# Patient Record
Sex: Female | Born: 1955 | Race: White | Hispanic: No | Smoking: Never smoker
Health system: Southern US, Community
[De-identification: ages and names within clinical notes are randomized; demographics above are authoritative.]

## PROBLEM LIST (undated history)

## (undated) DIAGNOSIS — M549 Dorsalgia, unspecified: Secondary | ICD-10-CM

## (undated) DIAGNOSIS — E119 Type 2 diabetes mellitus without complications: Secondary | ICD-10-CM

## (undated) DIAGNOSIS — N200 Calculus of kidney: Secondary | ICD-10-CM

## (undated) DIAGNOSIS — M51369 Other intervertebral disc degeneration, lumbar region without mention of lumbar back pain or lower extremity pain: Secondary | ICD-10-CM

## (undated) DIAGNOSIS — M5136 Other intervertebral disc degeneration, lumbar region: Secondary | ICD-10-CM

## (undated) DIAGNOSIS — K449 Diaphragmatic hernia without obstruction or gangrene: Secondary | ICD-10-CM

## (undated) DIAGNOSIS — G8929 Other chronic pain: Secondary | ICD-10-CM

## (undated) DIAGNOSIS — R109 Unspecified abdominal pain: Secondary | ICD-10-CM

## (undated) DIAGNOSIS — M75100 Unspecified rotator cuff tear or rupture of unspecified shoulder, not specified as traumatic: Secondary | ICD-10-CM

## (undated) HISTORY — PX: HERNIA REPAIR: SHX51

## (undated) HISTORY — PX: CHOLECYSTECTOMY: SHX55

## (undated) HISTORY — PX: BACK SURGERY: SHX140

## (undated) HISTORY — PX: HIATAL HERNIA REPAIR: SHX195

## (undated) HISTORY — PX: OTHER SURGICAL HISTORY: SHX169

## (undated) HISTORY — PX: ABDOMINAL HYSTERECTOMY: SHX81

---

## 2013-06-08 ENCOUNTER — Encounter (HOSPITAL_COMMUNITY): Payer: Self-pay | Admitting: Emergency Medicine

## 2013-06-08 ENCOUNTER — Emergency Department (HOSPITAL_COMMUNITY): Payer: Self-pay

## 2013-06-08 ENCOUNTER — Emergency Department (HOSPITAL_COMMUNITY)
Admission: EM | Admit: 2013-06-08 | Discharge: 2013-06-08 | Disposition: A | Discharge status: Home / Self Care | Payer: Self-pay | Attending: Emergency Medicine | Admitting: Emergency Medicine

## 2013-06-08 DIAGNOSIS — Z87442 Personal history of urinary calculi: Secondary | ICD-10-CM | POA: Insufficient documentation

## 2013-06-08 DIAGNOSIS — R109 Unspecified abdominal pain: Secondary | ICD-10-CM | POA: Insufficient documentation

## 2013-06-08 DIAGNOSIS — E119 Type 2 diabetes mellitus without complications: Secondary | ICD-10-CM | POA: Insufficient documentation

## 2013-06-08 HISTORY — DX: Calculus of kidney: N20.0

## 2013-06-08 HISTORY — DX: Type 2 diabetes mellitus without complications: E11.9

## 2013-06-08 LAB — COMPREHENSIVE METABOLIC PANEL
Albumin: 4.1 g/dL (ref 3.5–5.2)
Alkaline Phosphatase: 67 U/L (ref 39–117)
BUN: 12 mg/dL (ref 6–23)
CO2: 24 mEq/L (ref 19–32)
Chloride: 103 mEq/L (ref 96–112)
GFR calc non Af Amer: >90 mL/min (ref >90)
Potassium: 3.6 mEq/L (ref 3.5–5.1)
Total Bilirubin: 0.2 mg/dL — ABNORMAL LOW (ref 0.3–1.2)

## 2013-06-08 LAB — URINALYSIS, ROUTINE W REFLEX MICROSCOPIC
Glucose, UA: NEGATIVE mg/dL
Hgb urine dipstick: NEGATIVE
Ketones, ur: NEGATIVE mg/dL
Protein, ur: NEGATIVE mg/dL
Urobilinogen, UA: 0.2 mg/dL (ref 0.0–1.0)

## 2013-06-08 LAB — CBC WITH DIFFERENTIAL/PLATELET
Basophils Relative: 1 % (ref 0–1)
HCT: 42.8 % (ref 36.0–46.0)
Hemoglobin: 13.8 g/dL (ref 12.0–15.0)
Lymphocytes Relative: 29 % (ref 12–46)
Lymphs Abs: 2.3 10*3/uL (ref 0.7–4.0)
MCHC: 32.2 g/dL (ref 30.0–36.0)
Monocytes Absolute: 0.8 10*3/uL (ref 0.1–1.0)
Monocytes Relative: 10 % (ref 3–12)
Neutro Abs: 4.6 10*3/uL (ref 1.7–7.7)
Neutrophils Relative %: 58 % (ref 43–77)
RBC: 4.79 MIL/uL (ref 3.87–5.11)
WBC: 8 10*3/uL (ref 4.0–10.5)

## 2013-06-08 LAB — URINE MICROSCOPIC-ADD ON

## 2013-06-08 MED ORDER — HYDROMORPHONE HCL PF 2 MG/ML IJ SOLN
2.0000 mg | Freq: Once | INTRAMUSCULAR | Status: AC
  Administered 2013-06-08: 2 mg via INTRAMUSCULAR
  Filled 2013-06-08: qty 1

## 2013-06-08 MED ORDER — HYDROCODONE-ACETAMINOPHEN 5-325 MG PO TABS: 1.0000 | ORAL_TABLET | Freq: Four times a day (QID) | ORAL | Status: DC | PRN

## 2013-06-08 MED ORDER — HYDROMORPHONE HCL PF 1 MG/ML IJ SOLN
1.0000 mg | Freq: Once | INTRAMUSCULAR | Status: DC
  Filled 2013-06-08: qty 1

## 2013-06-08 MED ORDER — PROMETHAZINE HCL 25 MG/ML IJ SOLN
25.0000 mg | Freq: Once | INTRAMUSCULAR | Status: AC
  Administered 2013-06-08: 25 mg via INTRAMUSCULAR
  Filled 2013-06-08: qty 1

## 2013-06-08 MED ORDER — PROMETHAZINE HCL 25 MG/ML IJ SOLN
25.0000 mg | Freq: Once | INTRAMUSCULAR | Status: DC
  Filled 2013-06-08: qty 1

## 2013-06-08 NOTE — ED Notes (Signed)
Pt reports right sided 9/10 flank pain that started at 1500. Pt states reports a history of kidney stones. Pt reports nausea and emesis.

## 2013-06-08 NOTE — ED Provider Notes (Signed)
CSN: [id number]     Arrival date & time 06/08/13  1818 History   First MD Initiated Contact with Patient 06/08/13 1900     Chief Complaint  Patient presents with  . Flank Pain   (Consider location/radiation/quality/duration/timing/severity/associated sxs/prior Treatment) HPI Comments: Patient presents emergency department with chief complaint of right-sided flank pain. States the pain began around 3:00 today while she was at the grocery store. She states the pain is 9/10. She endorses mild associated nausea, but no vomiting. She states that she has a history of kidney stones, remarking that she has had 5. She has had lithotripsy 5 times. She states that she is not from this area. She denies any dysuria.  The history is provided by the patient. No language interpreter was used.    Past Medical History  Diagnosis Date  . Kidney stone   . Diabetes mellitus without complication    Past Surgical History  Procedure Laterality Date  . Back surgery    . Hernia repair    . Abdominal hysterectomy    . Cholecystectomy     No family history on file. History  Substance Use Topics  . Smoking status: Never Smoker   . Smokeless tobacco: Never Used  . Alcohol Use: No   OB History   Grav Para Term Preterm Abortions TAB SAB Ect Mult Living                 Review of Systems  All other systems reviewed and are negative.    Allergies  Review of patient's allergies indicates not on file.  Home Medications  No current outpatient prescriptions on file. BP 163/102  Pulse 96  Temp(Src) 98.8 F (37.1 C) (Oral)  Resp 20  SpO2 96% Physical Exam  Nursing note and vitals reviewed. Constitutional: She is oriented to person, place, and time. She appears well-developed and well-nourished.  HENT:  Head: Normocephalic and atraumatic.  Eyes: Conjunctivae and EOM are normal. Pupils are equal, round, and reactive to light.  Neck: Normal range of motion. Neck supple.  Cardiovascular: Normal rate  and regular rhythm.  Exam reveals no gallop and no friction rub.   No murmur heard. Pulmonary/Chest: Effort normal and breath sounds normal. No respiratory distress. She has no wheezes. She has no rales. She exhibits no tenderness.  Abdominal: Soft. Bowel sounds are normal. She exhibits no distension and no mass. There is no tenderness. There is no rebound and no guarding.  Right-sided CVA tenderness  Musculoskeletal: Normal range of motion. She exhibits no edema and no tenderness.  Neurological: She is alert and oriented to person, place, and time.  Skin: Skin is warm and dry.  Psychiatric: She has a normal mood and affect. Her behavior is normal. Judgment and thought content normal.    ED Course  Procedures (including critical care time) Labs Review Labs Reviewed  CBC WITH DIFFERENTIAL  COMPREHENSIVE METABOLIC PANEL  URINALYSIS, ROUTINE W REFLEX MICROSCOPIC   Results for orders placed during the hospital encounter of 06/08/13  CBC WITH DIFFERENTIAL      Result Value Range   WBC 8.0  4.0 - 10.5 K/uL   RBC 4.79  3.87 - 5.11 MIL/uL   Hemoglobin 13.8  12.0 - 15.0 g/dL   HCT 40.9  81.1 - 91.4 %   MCV 89.4  78.0 - 100.0 fL   MCH 28.8  26.0 - 34.0 pg   MCHC 32.2  30.0 - 36.0 g/dL   RDW 78.2  95.6 - 21.3 %  Platelets 295  150 - 400 K/uL   Neutrophils Relative % 58  43 - 77 %   Neutro Abs 4.6  1.7 - 7.7 K/uL   Lymphocytes Relative 29  12 - 46 %   Lymphs Abs 2.3  0.7 - 4.0 K/uL   Monocytes Relative 10  3 - 12 %   Monocytes Absolute 0.8  0.1 - 1.0 K/uL   Eosinophils Relative 3  0 - 5 %   Eosinophils Absolute 0.2  0.0 - 0.7 K/uL   Basophils Relative 1  0 - 1 %   Basophils Absolute 0.1  0.0 - 0.1 K/uL  COMPREHENSIVE METABOLIC PANEL      Result Value Range   Sodium 139  135 - 145 mEq/L   Potassium 3.6  3.5 - 5.1 mEq/L   Chloride 103  96 - 112 mEq/L   CO2 24  19 - 32 mEq/L   Glucose, Bld 89  70 - 99 mg/dL   BUN 12  6 - 23 mg/dL   Creatinine, Ser 8.29  0.50 - 1.10 mg/dL   Calcium  9.5  8.4 - 56.2 mg/dL   Total Protein 7.5  6.0 - 8.3 g/dL   Albumin 4.1  3.5 - 5.2 g/dL   AST 15  0 - 37 U/L   ALT 10  0 - 35 U/L   Alkaline Phosphatase 67  39 - 117 U/L   Total Bilirubin 0.2 (*) 0.3 - 1.2 mg/dL   GFR calc non Af Amer >90  >90 mL/min   GFR calc Af Amer >90  >90 mL/min  URINALYSIS, ROUTINE W REFLEX MICROSCOPIC      Result Value Range   Color, Urine YELLOW  YELLOW   APPearance CLEAR  CLEAR   Specific Gravity, Urine 1.032 (*) 1.005 - 1.030   pH 5.5  5.0 - 8.0   Glucose, UA NEGATIVE  NEGATIVE mg/dL   Hgb urine dipstick NEGATIVE  NEGATIVE   Bilirubin Urine NEGATIVE  NEGATIVE   Ketones, ur NEGATIVE  NEGATIVE mg/dL   Protein, ur NEGATIVE  NEGATIVE mg/dL   Urobilinogen, UA 0.2  0.0 - 1.0 mg/dL   Nitrite NEGATIVE  NEGATIVE   Leukocytes, UA MODERATE (*) NEGATIVE  URINE MICROSCOPIC-ADD ON      Result Value Range   Squamous Epithelial / LPF RARE  RARE   WBC, UA 3-6  <3 WBC/hpf   Bacteria, UA RARE  RARE   Casts HYALINE CASTS (*) NEGATIVE   Crystals CA OXALATE CRYSTALS (*) NEGATIVE   Ct Abdomen Pelvis Wo Contrast  06/08/2013   CLINICAL DATA:  Right flank pain, rule out stones, nose air, vomiting  EXAM: CT ABDOMEN AND PELVIS WITHOUT CONTRAST  TECHNIQUE: Multidetector CT imaging of the abdomen and pelvis was performed following the standard protocol without intravenous contrast.  COMPARISON:  None.  FINDINGS: Sagittal images of the spine shows degenerative changes lumbar spine. Significant disc space flattening with vacuum disc phenomenon and mild posterior spurring at L3-L4 level. Mild disc space flattening at L5-S1 level.  Postsurgical changes are noted GE junction region. There is a hiatal hernia in left retrocardiac position with air-fluid level measures 7.3 by 7.1 cm.  Heart size is within normal limits. Unenhanced liver shows no biliary ductal dilatation. The patient is status postcholecystectomy.  Unenhanced pancreas, spleen and adrenal glands are unremarkable. Mild thickening  of left adrenal gland probable benign in nature. Kidneys are symmetrical in size at unenhanced kidneys are symmetrical in size. Nonobstructive calcified calculus in upper  pole of the right kidney measures 2.7 mm.  Nonobstructive calcified calculus in upper pole of the left kidney measures 5.8 mm. Nonobstructive calcified calculus in midpole of the left kidney measures 5 mm. No hydronephrosis or hydroureter.  No calcified ureteral calculi are noted bilaterally.  There is no pericecal inflammation. The appendix is not identified. A surgical clip is noted just lateral to cecum.  Surgical clips are noted within pelvis post hysterectomy. Moderate gas noted within rectum. The urinary bladder is empty limiting its assessment. Bilateral distal ureter is unremarkable. Few sigmoid colon diverticula without evidence of acute diverticulitis.  Mild gaseous distended small bowel loops without definite evidence of small bowel obstruction. No transition point in caliber. Mild ileus or enteritis cannot be excluded.  IMPRESSION: 1. There is bilateral nonobstructive nephrolithiasis. No hydronephrosis or hydroureter. No calcified ureteral calculi. 2. Status post cholecystectomy. 3. Postsurgical changes are noted GE junction region. There is a hiatal hernia with air-fluid level measures at least 7 cm. 4. Nonspecific mild gaseous distended small bowel loops without evidence of small bowel obstruction. Nonspecific enteritis or mild ileus cannot be excluded. 5. Status cholecystectomy. 6. Gaseous distension of the rectum.   Electronically Signed   By: Natasha Mead   On: 06/08/2013 20:14     MDM   1. Flank pain     Patient with history of kidney stones. States that she is having right flank pain, which feels similar to past kidney stones. As the patient has had 5 lithotripsy procedures, I do feel that evaluation with CT is warranted to gauge the size possible stone. Will give pain medicine and nausea medicine. Will  reevaluate.  Patient discussed with Dr. Fayrene Fearing, who has reviewed the labs, CT scan results, and workup.  Agrees that pain management and discharge to home with appropriate follow-up is acceptable at this time.      Roxy Horseman, PA-C 06/08/13 2135

## 2013-06-08 NOTE — Progress Notes (Signed)
[facility] needs.  Patient does not have insurance.  EDCM provided patient with information about Affordable Care Act, Medicaid and Inclusive insurance, list of discounted paharmacies and website needymeds.org for medication assistance, list of financial assistance in the community such as local churches and [employer].  Patient and husband thankful for reosurces.  No further needs at this time.

## 2013-06-10 ENCOUNTER — Emergency Department (HOSPITAL_COMMUNITY)
Admission: EM | Admit: 2013-06-10 | Discharge: 2013-06-10 | Disposition: A | Discharge status: Home / Self Care | Payer: Self-pay | Attending: Emergency Medicine | Admitting: Emergency Medicine

## 2013-06-10 ENCOUNTER — Encounter (HOSPITAL_COMMUNITY): Payer: Self-pay

## 2013-06-10 ENCOUNTER — Emergency Department (HOSPITAL_COMMUNITY): Payer: Self-pay

## 2013-06-10 DIAGNOSIS — Z79899 Other long term (current) drug therapy: Secondary | ICD-10-CM | POA: Insufficient documentation

## 2013-06-10 DIAGNOSIS — Z90710 Acquired absence of both cervix and uterus: Secondary | ICD-10-CM | POA: Insufficient documentation

## 2013-06-10 DIAGNOSIS — Z87828 Personal history of other (healed) physical injury and trauma: Secondary | ICD-10-CM | POA: Insufficient documentation

## 2013-06-10 DIAGNOSIS — N200 Calculus of kidney: Secondary | ICD-10-CM | POA: Insufficient documentation

## 2013-06-10 DIAGNOSIS — Z880 Allergy status to penicillin: Secondary | ICD-10-CM | POA: Insufficient documentation

## 2013-06-10 DIAGNOSIS — Z9089 Acquired absence of other organs: Secondary | ICD-10-CM | POA: Insufficient documentation

## 2013-06-10 DIAGNOSIS — E119 Type 2 diabetes mellitus without complications: Secondary | ICD-10-CM | POA: Insufficient documentation

## 2013-06-10 DIAGNOSIS — Z9889 Other specified postprocedural states: Secondary | ICD-10-CM | POA: Insufficient documentation

## 2013-06-10 HISTORY — DX: Unspecified rotator cuff tear or rupture of unspecified shoulder, not specified as traumatic: M75.100

## 2013-06-10 LAB — BASIC METABOLIC PANEL
CO2: 21 mEq/L (ref 19–32)
Chloride: 100 mEq/L (ref 96–112)
GFR calc non Af Amer: >90 mL/min (ref >90)
Glucose, Bld: 115 mg/dL — ABNORMAL HIGH (ref 70–99)
Potassium: 3.8 mEq/L (ref 3.5–5.1)
Sodium: 135 mEq/L (ref 135–145)

## 2013-06-10 LAB — CBC WITH DIFFERENTIAL/PLATELET
Eosinophils Absolute: 0.2 10*3/uL (ref 0.0–0.7)
Lymphocytes Relative: 28 % (ref 12–46)
Lymphs Abs: 2 10*3/uL (ref 0.7–4.0)
Neutro Abs: 4.4 10*3/uL (ref 1.7–7.7)
Neutrophils Relative %: 62 % (ref 43–77)
Platelets: 338 10*3/uL (ref 150–400)
RBC: 4.92 MIL/uL (ref 3.87–5.11)
WBC: 7.2 10*3/uL (ref 4.0–10.5)

## 2013-06-10 LAB — URINALYSIS, ROUTINE W REFLEX MICROSCOPIC
Nitrite: NEGATIVE
Specific Gravity, Urine: 1.027 (ref 1.005–1.030)
Urobilinogen, UA: 0.2 mg/dL (ref 0.0–1.0)
pH: 6 (ref 5.0–8.0)

## 2013-06-10 LAB — URINE MICROSCOPIC-ADD ON

## 2013-06-10 MED ORDER — HYDROMORPHONE HCL PF 1 MG/ML IJ SOLN
1.0000 mg | Freq: Once | INTRAMUSCULAR | Status: AC
  Administered 2013-06-10: 1 mg via INTRAMUSCULAR
  Filled 2013-06-10: qty 1

## 2013-06-10 MED ORDER — HYDROCODONE-ACETAMINOPHEN 5-325 MG PO TABS
2.0000 | ORAL_TABLET | Freq: Once | ORAL | Status: AC
  Administered 2013-06-10: 2 via ORAL
  Filled 2013-06-10: qty 2

## 2013-06-10 MED ORDER — PROMETHAZINE HCL 25 MG RE SUPP
25.0000 mg | Freq: Once | RECTAL | Status: AC
  Administered 2013-06-10: 25 mg via RECTAL
  Filled 2013-06-10: qty 1

## 2013-06-10 MED ORDER — HYDROMORPHONE HCL PF 1 MG/ML IJ SOLN
1.0000 mg | Freq: Once | INTRAMUSCULAR | Status: AC
  Administered 2013-06-10: 1 mg via INTRAVENOUS
  Filled 2013-06-10: qty 1

## 2013-06-10 MED ORDER — HYDROCODONE-ACETAMINOPHEN 7.5-500 MG PO TABS: 1.0000 | ORAL_TABLET | Freq: Four times a day (QID) | ORAL | Status: DC | PRN

## 2013-06-10 NOTE — ED Notes (Signed)
[place] in room performing procedure

## 2013-06-10 NOTE — ED Provider Notes (Signed)
CSN: [id number]     Arrival date & time 06/10/13  1709 History   First MD Initiated Contact with Patient 06/10/13 1729     Chief Complaint  Patient presents with  . Flank Pain  . Nephrolithiasis   (Consider location/radiation/quality/duration/timing/severity/associated sxs/prior Treatment) HPI Comments: 57 year old female with a history of recurrent kidney stones requiring lithotripsy in the past presenting with acute onset of right flank pain which she describes as consistent with prior kidney stones. She was evaluated for similar symptoms 2 days ago, and CT imaging demonstrated bilateral nonobstructing nephrolithiasis.  Her urologist is Dr. Benancio Deeds in [place].  Patient is a 57 y.o. female presenting with flank pain.  Flank Pain This is a recurrent problem. The current episode started 3 to 5 hours ago. The problem occurs constantly. The problem has been gradually worsening. Associated symptoms include abdominal pain. Pertinent negatives include no chest pain and no shortness of breath. Nothing aggravates the symptoms. Nothing relieves the symptoms.    Past Medical History  Diagnosis Date  . Kidney stone   . Diabetes mellitus without complication   . Rotator cuff tear    Past Surgical History  Procedure Laterality Date  . Back surgery    . Hernia repair    . Abdominal hysterectomy    . Cholecystectomy    . Lithrotripsy     Family History  Problem Relation Age of Onset  . Diabetes Mother   . Cancer Father   . Diabetes Sister   . Diabetes Brother    History  Substance Use Topics  . Smoking status: Never Smoker   . Smokeless tobacco: Never Used  . Alcohol Use: No   OB History   Grav Para Term Preterm Abortions TAB SAB Ect Mult Living                 Review of Systems  Constitutional: Negative for fever.  HENT: Negative for congestion.   Respiratory: Negative for cough and shortness of breath.   Cardiovascular: Negative for chest pain.  Gastrointestinal:  Positive for abdominal pain. Negative for nausea, vomiting and diarrhea.  Genitourinary: Positive for flank pain.  All other systems reviewed and are negative.    Allergies  Ceclor; Cinnamon; Demerol; Opium; Reglan; Anzemet; Aspirin; Compazine; Morphine and related; Norflex; Nubain; Penicillins; Percocet; Stadol; Toradol; Ultram; Vistaril; and Zofran  Home Medications   Current Outpatient Rx  Name  Route  Sig  Dispense  Refill  . esomeprazole (NEXIUM) 20 MG capsule   Oral   Take 20 mg by mouth daily before breakfast.         . HYDROcodone-acetaminophen (NORCO/VICODIN) 5-325 MG per tablet   Oral   Take 1 tablet by mouth every 6 (six) hours as needed for pain.   10 tablet   0   . promethazine (PHENERGAN) 25 MG tablet   Oral   Take 25 mg by mouth every 6 (six) hours as needed for nausea.          BP 133/75  Pulse 92  Temp(Src) 98.2 F (36.8 C) (Oral)  Resp 18  SpO2 99% Physical Exam  Nursing note and vitals reviewed. Constitutional: She is oriented to person, place, and time. She appears well-developed and well-nourished. No distress.  HENT:  Head: Normocephalic and atraumatic.  Mouth/Throat: Oropharynx is clear and moist.  Eyes: Conjunctivae are normal. Pupils are equal, round, and reactive to light. No scleral icterus.  Neck: Neck supple.  Cardiovascular: Normal rate, regular rhythm, normal heart sounds and intact  distal pulses.   No murmur heard. Pulmonary/Chest: Effort normal and breath sounds normal. No stridor. No respiratory distress. She has no rales.  Abdominal: Soft. Bowel sounds are normal. She exhibits no distension. There is no tenderness. There is CVA tenderness (right). There is no rigidity, no rebound and no guarding.  Musculoskeletal: Normal range of motion.  Neurological: She is alert and oriented to person, place, and time.  Skin: Skin is warm and dry. No rash noted.  Psychiatric: She has a normal mood and affect. Her behavior is normal.    ED  Course  Angiocath insertion Date/Time: 06/10/2013 6:08 PM Performed by: Blake Divine DAVID Authorized by: Blake Divine DAVID Consent: Verbal consent obtained. Risks and benefits: risks, benefits and alternatives were discussed Comments: 20-gauge Angiocath was inserted in into a right sided brachial vein under ultrasound guidance. Good return of dark nonpulsatile blood flow. Flushed easily.   (including critical care time) Labs Review Labs Reviewed  URINALYSIS, ROUTINE W REFLEX MICROSCOPIC   Imaging Review Ct Abdomen Pelvis Wo Contrast  06/08/2013   CLINICAL DATA:  Right flank pain, rule out stones, nose air, vomiting  EXAM: CT ABDOMEN AND PELVIS WITHOUT CONTRAST  TECHNIQUE: Multidetector CT imaging of the abdomen and pelvis was performed following the standard protocol without intravenous contrast.  COMPARISON:  None.  FINDINGS: Sagittal images of the spine shows degenerative changes lumbar spine. Significant disc space flattening with vacuum disc phenomenon and mild posterior spurring at L3-L4 level. Mild disc space flattening at L5-S1 level.  Postsurgical changes are noted GE junction region. There is a hiatal hernia in left retrocardiac position with air-fluid level measures 7.3 by 7.1 cm.  Heart size is within normal limits. Unenhanced liver shows no biliary ductal dilatation. The patient is status postcholecystectomy.  Unenhanced pancreas, spleen and adrenal glands are unremarkable. Mild thickening of left adrenal gland probable benign in nature. Kidneys are symmetrical in size at unenhanced kidneys are symmetrical in size. Nonobstructive calcified calculus in upper pole of the right kidney measures 2.7 mm.  Nonobstructive calcified calculus in upper pole of the left kidney measures 5.8 mm. Nonobstructive calcified calculus in midpole of the left kidney measures 5 mm. No hydronephrosis or hydroureter.  No calcified ureteral calculi are noted bilaterally.  There is no pericecal inflammation. The  appendix is not identified. A surgical clip is noted just lateral to cecum.  Surgical clips are noted within pelvis post hysterectomy. Moderate gas noted within rectum. The urinary bladder is empty limiting its assessment. Bilateral distal ureter is unremarkable. Few sigmoid colon diverticula without evidence of acute diverticulitis.  Mild gaseous distended small bowel loops without definite evidence of small bowel obstruction. No transition point in caliber. Mild ileus or enteritis cannot be excluded.  IMPRESSION: 1. There is bilateral nonobstructive nephrolithiasis. No hydronephrosis or hydroureter. No calcified ureteral calculi. 2. Status post cholecystectomy. 3. Postsurgical changes are noted GE junction region. There is a hiatal hernia with air-fluid level measures at least 7 cm. 4. Nonspecific mild gaseous distended small bowel loops without evidence of small bowel obstruction. Nonspecific enteritis or mild ileus cannot be excluded. 5. Status cholecystectomy. 6. Gaseous distension of the rectum.   Electronically Signed   By: Natasha Mead   On: 06/08/2013 20:14    MDM   1. Kidney stone    Hx of recurrent kidney stones.  CT from 2 days ago showed a 2.7 mm kidney stone on the right. It's possible that this kidney stone has moved. Will obtain ultrasound to look  for obstruction.  Abdomen is soft and nontender.    Ultrasound negative for obstruction.  Pain controlled with dilaudid and norco.  I have a low suspicion for other intraabdominal pathology, reinforced by her benign abdominal exam.  Her aorta appears normal caliber.  She will follow up with her urologist at next available appointment.  Return precautions given.    Candyce Churn, MD 06/10/13 [phone]

## 2013-06-10 NOTE — ED Notes (Signed)
Pt returned from US

## 2013-06-10 NOTE — ED Notes (Signed)
Patient c/o right flank pain. Patient has a known kidney stone. Patient states she was seen 2 days ago for the same. Patient states she last took Norco 1 tablet this AM. patiaent states no relief and pain ahas gotten progressively worse. Patient states she has blood in her urine.

## 2013-06-11 ENCOUNTER — Telehealth (HOSPITAL_COMMUNITY): Payer: Self-pay | Admitting: Emergency Medicine

## 2013-06-11 NOTE — Telephone Encounter (Signed)
Pharm called. loratab does not come in 7.5/500. This was changed to 7.5/325.

## 2013-06-13 NOTE — ED Provider Notes (Signed)
Medical screening examination/treatment/procedure(s) were performed by non-physician practitioner and as supervising physician I was immediately available for consultation/collaboration.   Claudean Kinds, MD 06/13/13 [phone]

## 2013-07-04 ENCOUNTER — Emergency Department (HOSPITAL_COMMUNITY)
Admission: EM | Admit: 2013-07-04 | Discharge: 2013-07-05 | Disposition: A | Discharge status: Home / Self Care | Payer: Self-pay | Attending: Emergency Medicine | Admitting: Emergency Medicine

## 2013-07-04 ENCOUNTER — Encounter (HOSPITAL_COMMUNITY): Payer: Self-pay | Admitting: Emergency Medicine

## 2013-07-04 ENCOUNTER — Emergency Department (HOSPITAL_COMMUNITY): Payer: Self-pay

## 2013-07-04 DIAGNOSIS — Z880 Allergy status to penicillin: Secondary | ICD-10-CM | POA: Insufficient documentation

## 2013-07-04 DIAGNOSIS — E119 Type 2 diabetes mellitus without complications: Secondary | ICD-10-CM | POA: Insufficient documentation

## 2013-07-04 DIAGNOSIS — Z87442 Personal history of urinary calculi: Secondary | ICD-10-CM | POA: Insufficient documentation

## 2013-07-04 DIAGNOSIS — R112 Nausea with vomiting, unspecified: Secondary | ICD-10-CM | POA: Insufficient documentation

## 2013-07-04 DIAGNOSIS — R109 Unspecified abdominal pain: Secondary | ICD-10-CM

## 2013-07-04 DIAGNOSIS — Z79899 Other long term (current) drug therapy: Secondary | ICD-10-CM | POA: Insufficient documentation

## 2013-07-04 DIAGNOSIS — Z87828 Personal history of other (healed) physical injury and trauma: Secondary | ICD-10-CM | POA: Insufficient documentation

## 2013-07-04 DIAGNOSIS — N390 Urinary tract infection, site not specified: Secondary | ICD-10-CM | POA: Insufficient documentation

## 2013-07-04 LAB — URINALYSIS, ROUTINE W REFLEX MICROSCOPIC
Protein, ur: NEGATIVE mg/dL
Urobilinogen, UA: 0.2 mg/dL (ref 0.0–1.0)

## 2013-07-04 LAB — CBC WITH DIFFERENTIAL/PLATELET
Basophils Absolute: 0.1 10*3/uL (ref 0.0–0.1)
Basophils Relative: 1 % (ref 0–1)
Eosinophils Absolute: 0.2 10*3/uL (ref 0.0–0.7)
Eosinophils Relative: 3 % (ref 0–5)
Lymphocytes Relative: 22 % (ref 12–46)
MCH: 28.5 pg (ref 26.0–34.0)
MCV: 88.3 fL (ref 78.0–100.0)
Platelets: 332 10*3/uL (ref 150–400)
RDW: 13.2 % (ref 11.5–15.5)
WBC: 9.8 10*3/uL (ref 4.0–10.5)

## 2013-07-04 LAB — URINE MICROSCOPIC-ADD ON

## 2013-07-04 MED ORDER — HYDROMORPHONE HCL PF 2 MG/ML IJ SOLN
2.0000 mg | Freq: Once | INTRAMUSCULAR | Status: AC
  Administered 2013-07-04: 2 mg via INTRAMUSCULAR
  Filled 2013-07-04: qty 1

## 2013-07-04 MED ORDER — PROMETHAZINE HCL 25 MG/ML IJ SOLN
25.0000 mg | Freq: Four times a day (QID) | INTRAMUSCULAR | Status: DC | PRN
  Administered 2013-07-04: 25 mg via INTRAMUSCULAR
  Filled 2013-07-04: qty 1

## 2013-07-04 MED ORDER — HYDROMORPHONE HCL PF 1 MG/ML IJ SOLN
1.0000 mg | Freq: Once | INTRAMUSCULAR | Status: AC
  Administered 2013-07-05: 1 mg via INTRAVENOUS
  Filled 2013-07-04: qty 1

## 2013-07-04 MED ORDER — PROMETHAZINE HCL 25 MG/ML IJ SOLN
25.0000 mg | Freq: Once | INTRAMUSCULAR | Status: DC
  Filled 2013-07-04: qty 1

## 2013-07-04 MED ORDER — SODIUM CHLORIDE 0.9 % IV BOLUS (SEPSIS)
1000.0000 mL | Freq: Once | INTRAVENOUS | Status: AC
  Administered 2013-07-04: 1000 mL via INTRAVENOUS

## 2013-07-04 NOTE — ED Provider Notes (Signed)
CSN: [id number]     Arrival date & time 07/04/13  1951 History   First MD Initiated Contact with Patient 07/04/13 2208     Chief Complaint  Patient presents with  . Flank Pain   (Consider location/radiation/quality/duration/timing/severity/associated sxs/prior Treatment) Patient is a 57 y.o. female presenting with flank pain. The history is provided by the patient and the spouse. No language interpreter was used.  Flank Pain This is a recurrent (L sided, sharp w/ radiation to LLQ.  ) problem. The current episode started 3 to 5 hours ago. The problem occurs constantly. The problem has not changed since onset.Associated symptoms include abdominal pain. Pertinent negatives include no chest pain, no headaches and no shortness of breath. Associated symptoms comments: Nausea, vomiting. No fevers.. Nothing aggravates the symptoms. Nothing relieves the symptoms. She has tried nothing for the symptoms. The treatment provided no (Hx of multiple prior kidney stones. Hx of lithotripsy x5 in past. ) relief.    Past Medical History  Diagnosis Date  . Kidney stone   . Diabetes mellitus without complication   . Rotator cuff tear    Past Surgical History  Procedure Laterality Date  . Back surgery    . Hernia repair    . Abdominal hysterectomy    . Cholecystectomy    . Lithrotripsy    . Hiatal hernia repair      x 6   Family History  Problem Relation Age of Onset  . Diabetes Mother   . Cancer Father   . Diabetes Sister   . Diabetes Brother    History  Substance Use Topics  . Smoking status: Never Smoker   . Smokeless tobacco: Never Used  . Alcohol Use: No   OB History   Grav Para Term Preterm Abortions TAB SAB Ect Mult Living                 Review of Systems  Constitutional: Negative for fever, chills, diaphoresis, activity change, appetite change and fatigue.  HENT: Negative for congestion, facial swelling, rhinorrhea and sore throat.   Eyes: Negative for photophobia and discharge.   Respiratory: Negative for cough, chest tightness and shortness of breath.   Cardiovascular: Negative for chest pain, palpitations and leg swelling.  Gastrointestinal: Positive for abdominal pain. Negative for nausea, vomiting and diarrhea.  Endocrine: Negative for polydipsia and polyuria.  Genitourinary: Positive for frequency, hematuria and flank pain. Negative for dysuria, difficulty urinating and pelvic pain.  Musculoskeletal: Negative for arthralgias, back pain, neck pain and neck stiffness.  Skin: Negative for color change and wound.  Allergic/Immunologic: Negative for immunocompromised state.  Neurological: Negative for facial asymmetry, weakness, numbness and headaches.  Hematological: Does not bruise/bleed easily.  Psychiatric/Behavioral: Negative for confusion and agitation.    Allergies  Ceclor; Cinnamon; Demerol; Opium; Reglan; Anzemet; Aspirin; Compazine; Morphine and related; Norflex; Nubain; Penicillins; Percocet; Stadol; Toradol; Ultram; Vistaril; and Zofran  Home Medications   Current Outpatient Rx  Name  Route  Sig  Dispense  Refill  . esomeprazole (NEXIUM) 20 MG capsule   Oral   Take 20 mg by mouth daily before breakfast.         . promethazine (PHENERGAN) 25 MG tablet   Oral   Take 25 mg by mouth every 6 (six) hours as needed for nausea.         Marland Kitchen HYDROcodone-acetaminophen (LORTAB) 7.5-325 MG per tablet   Oral   Take 1 tablet by mouth every 6 (six) hours as needed for pain.  20 tablet   0   . nitrofurantoin, macrocrystal-monohydrate, (MACROBID) 100 MG capsule   Oral   Take 1 capsule (100 mg total) by mouth 2 (two) times daily.   10 capsule   0    BP 137/76  Pulse 92  Temp(Src) 98.7 F (37.1 C) (Oral)  Resp 18  Ht 4\' 10"  (1.473 m)  Wt 138 lb (62.596 kg)  BMI 28.85 kg/m2  SpO2 97% Physical Exam  Constitutional: She is oriented to person, place, and time. She appears well-developed and well-nourished. No distress.  HENT:  Head: Normocephalic  and atraumatic.  Mouth/Throat: No oropharyngeal exudate.  Eyes: Pupils are equal, round, and reactive to light.  Neck: Normal range of motion. Neck supple.  Cardiovascular: Normal rate, regular rhythm and normal heart sounds.  Exam reveals no gallop and no friction rub.   No murmur heard. Pulmonary/Chest: Effort normal and breath sounds normal. No respiratory distress. She has no wheezes. She has no rales.  Abdominal: Soft. Bowel sounds are normal. She exhibits no distension and no mass. There is no tenderness. There is no rigidity, no rebound and no guarding.  L flank CVA ttp  Musculoskeletal: Normal range of motion. She exhibits no edema and no tenderness.  Neurological: She is alert and oriented to person, place, and time.  Skin: Skin is warm and dry.  Psychiatric: She has a normal mood and affect.    ED Course  Procedures (including critical care time) Labs Review Labs Reviewed  URINALYSIS, ROUTINE W REFLEX MICROSCOPIC - Abnormal; Notable for the following:    APPearance CLOUDY (*)    Specific Gravity, Urine 1.031 (*)    Hgb urine dipstick LARGE (*)    Leukocytes, UA MODERATE (*)    All other components within normal limits  COMPREHENSIVE METABOLIC PANEL - Abnormal; Notable for the following:    Glucose, Bld 111 (*)    Total Bilirubin 0.2 (*)    All other components within normal limits  URINE MICROSCOPIC-ADD ON - Abnormal; Notable for the following:    Squamous Epithelial / LPF FEW (*)    Bacteria, UA FEW (*)    All other components within normal limits  URINE CULTURE  CBC WITH DIFFERENTIAL   Imaging Review US Renal  07/04/2013   CLINICAL DATA:  Left flank pain  EXAM: RENAL/URINARY TRACT ULTRASOUND COMPLETE  COMPARISON:  Prior CT abdomen/ pelvis 06/14/2013  FINDINGS: Right Kidney  Length: 10.9 cm Echogenicity within normal limits. No mass or hydronephrosis visualized.  Left Kidney  Length: 10.2 cm normal echogenicity. No hydronephrosis. Nonobstructing 8.7 mm echogenic focus  with posterior acoustic shadowing consistent with patient's known renal stone.  Bladder: Appears normal for degree of bladder distention. Both ureteral jets are identified.  IMPRESSION: 1. No hydronephrosis the. 2. Nonobstructing nearly 9 mm stone in the left kidney at the junction of the upper and interpolar regions. Findings correlate with prior CT imaging. 3. Bilateral ureteral jets are identified.   Electronically Signed   By: Malachy Moan M.D.   On: 07/04/2013 23:48    MDM   1. Left flank pain   2. UTI (lower urinary tract infection)    Pt is a 57 y.o. female with Pmhx as above who presents with sudden onset L flank pain w/ radiation to LLQ about 1600. Pain sharp, constant, with assoc, n/v, urinary frequency, hematuria.  Hx of multiple similar episodes due to kidney stones in the past and pain described as typical kidney stone pain.  Last episode about  1 month ago on R side which had resolved.  On PE, pt uncomfortable, but non-toxic. +L CVA tenderness, abdominal exam benign.  Will treat symptomatically, get CBC, BMP, US renal system to r/o obstruction.   12:55 PM Cr stable.  No hydro.  +uti.  Will treat w/ macrobid.  Plan on d/c home if can tolerate PO.  Care transferred to Dr. Jeraldine Loots.         Shanna Cisco, MD 07/05/13 [phone]

## 2013-07-04 NOTE — ED Notes (Signed)
Pt c/o L flank pain sudden onset radiating to abdomen onset 1600, pt does have hx of kidney stones. +n/v.

## 2013-07-05 LAB — COMPREHENSIVE METABOLIC PANEL
ALT: 10 U/L (ref 0–35)
AST: 12 U/L (ref 0–37)
Calcium: 10 mg/dL (ref 8.4–10.5)
Sodium: 138 mEq/L (ref 135–145)
Total Protein: 7.3 g/dL (ref 6.0–8.3)

## 2013-07-05 MED ORDER — NITROFURANTOIN MONOHYD MACRO 100 MG PO CAPS: 100.0000 mg | ORAL_CAPSULE | Freq: Two times a day (BID) | ORAL | Status: DC

## 2013-07-05 MED ORDER — NITROFURANTOIN MONOHYD MACRO 100 MG PO CAPS
100.0000 mg | ORAL_CAPSULE | Freq: Once | ORAL | Status: AC
  Administered 2013-07-05: 100 mg via ORAL
  Filled 2013-07-05: qty 1

## 2013-07-05 MED ORDER — HYDROCODONE-ACETAMINOPHEN 7.5-325 MG PO TABS: 1.0000 | ORAL_TABLET | Freq: Four times a day (QID) | ORAL | Status: DC | PRN

## 2013-07-06 LAB — URINE CULTURE: Colony Count: 30000

## 2013-07-11 ENCOUNTER — Emergency Department (HOSPITAL_COMMUNITY)
Admission: EM | Admit: 2013-07-11 | Discharge: 2013-07-11 | Disposition: A | Discharge status: Home / Self Care | Payer: Self-pay | Attending: Emergency Medicine | Admitting: Emergency Medicine

## 2013-07-11 ENCOUNTER — Encounter (HOSPITAL_COMMUNITY): Payer: Self-pay | Admitting: Emergency Medicine

## 2013-07-11 DIAGNOSIS — E119 Type 2 diabetes mellitus without complications: Secondary | ICD-10-CM | POA: Insufficient documentation

## 2013-07-11 DIAGNOSIS — N23 Unspecified renal colic: Secondary | ICD-10-CM | POA: Insufficient documentation

## 2013-07-11 DIAGNOSIS — Z79899 Other long term (current) drug therapy: Secondary | ICD-10-CM | POA: Insufficient documentation

## 2013-07-11 DIAGNOSIS — Z880 Allergy status to penicillin: Secondary | ICD-10-CM | POA: Insufficient documentation

## 2013-07-11 DIAGNOSIS — R112 Nausea with vomiting, unspecified: Secondary | ICD-10-CM | POA: Insufficient documentation

## 2013-07-11 DIAGNOSIS — Z87442 Personal history of urinary calculi: Secondary | ICD-10-CM | POA: Insufficient documentation

## 2013-07-11 LAB — URINALYSIS, ROUTINE W REFLEX MICROSCOPIC
Glucose, UA: NEGATIVE mg/dL
Ketones, ur: NEGATIVE mg/dL
Urobilinogen, UA: 0.2 mg/dL (ref 0.0–1.0)
pH: 6.5 (ref 5.0–8.0)

## 2013-07-11 LAB — CBC WITH DIFFERENTIAL/PLATELET
Basophils Absolute: 0.1 10*3/uL (ref 0.0–0.1)
Basophils Relative: 1 % (ref 0–1)
Eosinophils Absolute: 0.1 10*3/uL (ref 0.0–0.7)
MCH: 29.5 pg (ref 26.0–34.0)
MCHC: 33.5 g/dL (ref 30.0–36.0)
Monocytes Absolute: 1 10*3/uL (ref 0.1–1.0)
Neutrophils Relative %: 71 % (ref 43–77)
Platelets: 344 10*3/uL (ref 150–400)
RBC: 4.98 MIL/uL (ref 3.87–5.11)
RDW: 13.2 % (ref 11.5–15.5)

## 2013-07-11 LAB — COMPREHENSIVE METABOLIC PANEL
ALT: 12 U/L (ref 0–35)
AST: 16 U/L (ref 0–37)
Albumin: 4.2 g/dL (ref 3.5–5.2)
Alkaline Phosphatase: 80 U/L (ref 39–117)
BUN: 13 mg/dL (ref 6–23)
GFR calc non Af Amer: >90 mL/min (ref >90)
Potassium: 4.4 mEq/L (ref 3.5–5.1)
Sodium: 135 mEq/L (ref 135–145)
Total Protein: 8 g/dL (ref 6.0–8.3)

## 2013-07-11 LAB — URINE MICROSCOPIC-ADD ON

## 2013-07-11 MED ORDER — HYDROMORPHONE HCL PF 1 MG/ML IJ SOLN
1.0000 mg | Freq: Once | INTRAMUSCULAR | Status: AC
  Administered 2013-07-11: 1 mg via INTRAVENOUS
  Filled 2013-07-11: qty 1

## 2013-07-11 MED ORDER — METOCLOPRAMIDE HCL 5 MG/ML IJ SOLN
10.0000 mg | Freq: Once | INTRAMUSCULAR | Status: DC
  Filled 2013-07-11: qty 2

## 2013-07-11 MED ORDER — PROMETHAZINE HCL 25 MG/ML IJ SOLN
25.0000 mg | Freq: Once | INTRAMUSCULAR | Status: AC
  Administered 2013-07-11: 25 mg via INTRAVENOUS
  Filled 2013-07-11: qty 1

## 2013-07-11 MED ORDER — PROMETHAZINE HCL 25 MG PO TABS: 25.0000 mg | ORAL_TABLET | Freq: Four times a day (QID) | ORAL | Status: DC | PRN

## 2013-07-11 MED ORDER — HYDROCODONE-ACETAMINOPHEN 7.5-325 MG PO TABS: 1.0000 | ORAL_TABLET | Freq: Four times a day (QID) | ORAL | Status: DC | PRN

## 2013-07-11 MED ORDER — SODIUM CHLORIDE 0.9 % IV SOLN
INTRAVENOUS | Status: DC
  Administered 2013-07-11: 15:00:00 via INTRAVENOUS

## 2013-07-11 NOTE — ED Provider Notes (Signed)
CSN: [id number]     Arrival date & time 07/11/13  1254 History   First MD Initiated Contact with Patient 07/11/13 1344     Chief Complaint  Patient presents with  . Flank Pain   (Consider location/radiation/quality/duration/timing/severity/associated sxs/prior Treatment) HPI Comments: Patient presents to the ER for evaluation of left flank pain. Patient reports that she had sudden onset of severe pain in the left flank area around 10:30 AM today. Patient has had accompanied nausea and vomiting. She has a history of recurrent kidney stones, this feels identical to previous presentations. She has not had any fever. She has not noticed hematuria.  Patient is a 57 y.o. female presenting with flank pain.  Flank Pain    Past Medical History  Diagnosis Date  . Kidney stone   . Diabetes mellitus without complication   . Rotator cuff tear    Past Surgical History  Procedure Laterality Date  . Back surgery    . Hernia repair    . Abdominal hysterectomy    . Cholecystectomy    . Lithrotripsy    . Hiatal hernia repair      x 6   Family History  Problem Relation Age of Onset  . Diabetes Mother   . Cancer Father   . Diabetes Sister   . Diabetes Brother    History  Substance Use Topics  . Smoking status: Never Smoker   . Smokeless tobacco: Never Used  . Alcohol Use: No   OB History   Grav Para Term Preterm Abortions TAB SAB Ect Mult Living                 Review of Systems  Gastrointestinal: Positive for vomiting.  Genitourinary: Positive for flank pain.  All other systems reviewed and are negative.    Allergies  Ceclor; Cinnamon; Demerol; Macrobid; Opium; Reglan; Anzemet; Aspirin; Compazine; Morphine and related; Norflex; Nubain; Penicillins; Percocet; Stadol; Toradol; Ultram; Vistaril; and Zofran  Home Medications   Current Outpatient Rx  Name  Route  Sig  Dispense  Refill  . esomeprazole (NEXIUM) 20 MG capsule   Oral   Take 20 mg by mouth daily before  breakfast.         . HYDROcodone-acetaminophen (LORTAB) 7.5-325 MG per tablet   Oral   Take 1 tablet by mouth every 6 (six) hours as needed for pain.   20 tablet   0   . nitrofurantoin, macrocrystal-monohydrate, (MACROBID) 100 MG capsule   Oral   Take 1 capsule (100 mg total) by mouth 2 (two) times daily.   10 capsule   0   . promethazine (PHENERGAN) 25 MG tablet   Oral   Take 25 mg by mouth every 6 (six) hours as needed for nausea.          BP 165/110  Pulse 92  Temp(Src) 98.7 F (37.1 C) (Oral)  Resp 20  SpO2 98% Physical Exam  Constitutional: She is oriented to person, place, and time. She appears well-developed and well-nourished. She appears distressed.  HENT:  Head: Normocephalic and atraumatic.  Right Ear: Hearing normal.  Left Ear: Hearing normal.  Nose: Nose normal.  Mouth/Throat: Oropharynx is clear and moist and mucous membranes are normal.  Eyes: Conjunctivae and EOM are normal. Pupils are equal, round, and reactive to light.  Neck: Normal range of motion. Neck supple.  Cardiovascular: Regular rhythm, S1 normal and S2 normal.  Exam reveals no gallop and no friction rub.   No murmur heard. Pulmonary/Chest: Effort  normal and breath sounds normal. No respiratory distress. She exhibits no tenderness.  Abdominal: Soft. Normal appearance and bowel sounds are normal. There is no hepatosplenomegaly. There is no tenderness. There is no rebound, no guarding, no tenderness at McBurney's point and negative Murphy's sign. No hernia.  Musculoskeletal: Normal range of motion.  Neurological: She is alert and oriented to person, place, and time. She has normal strength. No cranial nerve deficit or sensory deficit. Coordination normal. GCS eye subscore is 4. GCS verbal subscore is 5. GCS motor subscore is 6.  Skin: Skin is warm, dry and intact. No rash noted. No cyanosis.  Psychiatric: She has a normal mood and affect. Her speech is normal and behavior is normal. Thought  content normal.    ED Course  Procedures (including critical care time) Labs Review Labs Reviewed  CBC WITH DIFFERENTIAL - Abnormal; Notable for the following:    WBC 11.7 (*)    Neutro Abs 8.3 (*)    All other components within normal limits  COMPREHENSIVE METABOLIC PANEL - Abnormal; Notable for the following:    Total Bilirubin 0.2 (*)    All other components within normal limits  URINALYSIS, ROUTINE W REFLEX MICROSCOPIC - Abnormal; Notable for the following:    APPearance CLOUDY (*)    Hgb urine dipstick LARGE (*)    Leukocytes, UA MODERATE (*)    All other components within normal limits  URINE MICROSCOPIC-ADD ON - Abnormal; Notable for the following:    Squamous Epithelial / LPF FEW (*)    Crystals CA OXALATE CRYSTALS (*)    All other components within normal limits   Imaging Review No results found.  EKG Interpretation   None       MDM  Diagnosis: Renal colic  Patient presents to the ER for evaluation of left flank pain. Patient reports a history of kidney stones. Pain seems consistent with renal colic. Urinalysis today shows hematuria without any evidence of infection. Patient treated with narcotic analgesia here in the ER, will be discharged with Lortab. She has a urologist in [place]. She should contact her neurologist tomorrow.    Gilda Crease, MD 07/11/13 [phone]

## 2013-07-11 NOTE — ED Notes (Signed)
Pt c/o left sided flank pain that began at 1030 this morning. Pt reports a history of kidney stones. Pt reports nausea and emesis, however denies diarrhea. Pt is A/O x4.

## 2013-07-14 ENCOUNTER — Emergency Department (HOSPITAL_COMMUNITY)
Admission: EM | Admit: 2013-07-14 | Discharge: 2013-07-14 | Disposition: A | Discharge status: Home / Self Care | Payer: Self-pay | Attending: Emergency Medicine | Admitting: Emergency Medicine

## 2013-07-14 ENCOUNTER — Encounter (HOSPITAL_COMMUNITY): Payer: Self-pay | Admitting: Emergency Medicine

## 2013-07-14 ENCOUNTER — Emergency Department (HOSPITAL_COMMUNITY): Payer: Self-pay

## 2013-07-14 DIAGNOSIS — N23 Unspecified renal colic: Secondary | ICD-10-CM | POA: Insufficient documentation

## 2013-07-14 DIAGNOSIS — E119 Type 2 diabetes mellitus without complications: Secondary | ICD-10-CM | POA: Insufficient documentation

## 2013-07-14 DIAGNOSIS — Z87828 Personal history of other (healed) physical injury and trauma: Secondary | ICD-10-CM | POA: Insufficient documentation

## 2013-07-14 DIAGNOSIS — Z880 Allergy status to penicillin: Secondary | ICD-10-CM | POA: Insufficient documentation

## 2013-07-14 DIAGNOSIS — Z87442 Personal history of urinary calculi: Secondary | ICD-10-CM | POA: Insufficient documentation

## 2013-07-14 DIAGNOSIS — R112 Nausea with vomiting, unspecified: Secondary | ICD-10-CM | POA: Insufficient documentation

## 2013-07-14 LAB — URINALYSIS, ROUTINE W REFLEX MICROSCOPIC
Hgb urine dipstick: NEGATIVE
Protein, ur: NEGATIVE mg/dL
Urobilinogen, UA: 0.2 mg/dL (ref 0.0–1.0)

## 2013-07-14 LAB — BASIC METABOLIC PANEL
BUN: 11 mg/dL (ref 6–23)
Calcium: 9.7 mg/dL (ref 8.4–10.5)
Chloride: 103 mEq/L (ref 96–112)
GFR calc non Af Amer: >90 mL/min (ref >90)
Glucose, Bld: 115 mg/dL — ABNORMAL HIGH (ref 70–99)
Sodium: 139 mEq/L (ref 135–145)

## 2013-07-14 LAB — CBC WITH DIFFERENTIAL/PLATELET
Basophils Absolute: 0.1 10*3/uL (ref 0.0–0.1)
Basophils Relative: 1 % (ref 0–1)
Eosinophils Absolute: 0.2 10*3/uL (ref 0.0–0.7)
MCH: 29 pg (ref 26.0–34.0)
MCHC: 32.8 g/dL (ref 30.0–36.0)
Neutrophils Relative %: 65 % (ref 43–77)
Platelets: 337 10*3/uL (ref 150–400)
RBC: 4.34 MIL/uL (ref 3.87–5.11)
RDW: 13.2 % (ref 11.5–15.5)

## 2013-07-14 LAB — URINE MICROSCOPIC-ADD ON

## 2013-07-14 MED ORDER — HYDROMORPHONE HCL PF 1 MG/ML IJ SOLN
1.0000 mg | Freq: Once | INTRAMUSCULAR | Status: AC
  Administered 2013-07-14: 1 mg via INTRAMUSCULAR

## 2013-07-14 MED ORDER — HYDROMORPHONE HCL PF 1 MG/ML IJ SOLN
1.0000 mg | Freq: Once | INTRAMUSCULAR | Status: DC
  Filled 2013-07-14: qty 1

## 2013-07-14 MED ORDER — PROMETHAZINE HCL 25 MG PO TABS: 25.0000 mg | ORAL_TABLET | Freq: Once | ORAL | Status: DC

## 2013-07-14 MED ORDER — PROMETHAZINE HCL 25 MG/ML IJ SOLN
25.0000 mg | Freq: Once | INTRAMUSCULAR | Status: AC
  Administered 2013-07-14: 25 mg via INTRAMUSCULAR
  Filled 2013-07-14: qty 1

## 2013-07-14 MED ORDER — HYDROCODONE-ACETAMINOPHEN 5-325 MG PO TABS
1.0000 | ORAL_TABLET | Freq: Once | ORAL | Status: AC
  Administered 2013-07-14: 1 via ORAL
  Filled 2013-07-14: qty 1

## 2013-07-14 MED ORDER — SODIUM CHLORIDE 0.9 % IV BOLUS (SEPSIS)
1000.0000 mL | Freq: Once | INTRAVENOUS | Status: AC
  Administered 2013-07-14: 1000 mL via INTRAVENOUS

## 2013-07-14 NOTE — ED Notes (Signed)
Attempted IV start x 2.  No success.  IV team notified.  Patient reported ultrasound used in IV attempts in prior admissions to ED.

## 2013-07-14 NOTE — ED Notes (Signed)
Patient with history of kidney stones was diagnosed with another kidney stone about one week ago.  She was given Lortab 7.5mg  and took the last one this morning.  Pain started this morning then stopped and resumed at 1pm today.  Patient is having vomiting and left flank pain.  Rates pain as a 10.

## 2013-07-14 NOTE — ED Provider Notes (Signed)
CSN: [id number]     Arrival date & time 07/14/13  1430 History   First MD Initiated Contact with Patient 07/14/13 1502     Chief Complaint  Patient presents with  . Flank Pain   (Consider location/radiation/quality/duration/timing/severity/associated sxs/prior Treatment) Patient is a 57 y.o. female presenting with flank pain.  Flank Pain This is a recurrent problem. The current episode started 1 to 2 hours ago. The problem occurs constantly. The problem has not changed since onset.Pertinent negatives include no chest pain, no abdominal pain and no shortness of breath. Associated symptoms comments: Nausea and vomiting, no fevers. Nothing aggravates the symptoms. Nothing relieves the symptoms. She has tried nothing for the symptoms.    Past Medical History  Diagnosis Date  . Kidney stone   . Diabetes mellitus without complication   . Rotator cuff tear    Past Surgical History  Procedure Laterality Date  . Back surgery    . Hernia repair    . Abdominal hysterectomy    . Cholecystectomy    . Lithrotripsy    . Hiatal hernia repair      x 6   Family History  Problem Relation Age of Onset  . Diabetes Mother   . Cancer Father   . Diabetes Sister   . Diabetes Brother    History  Substance Use Topics  . Smoking status: Never Smoker   . Smokeless tobacco: Never Used  . Alcohol Use: No   OB History   Grav Para Term Preterm Abortions TAB SAB Ect Mult Living                 Review of Systems  Constitutional: Negative for fever.  HENT: Negative for congestion.   Respiratory: Negative for cough and shortness of breath.   Cardiovascular: Negative for chest pain.  Gastrointestinal: Negative for nausea, vomiting, abdominal pain and diarrhea.  Genitourinary: Positive for flank pain.  All other systems reviewed and are negative.    Allergies  Ceclor; Cinnamon; Demerol; Macrobid; Opium; Reglan; Anzemet; Aspirin; Compazine; Morphine and related; Norflex; Nubain; Penicillins;  Percocet; Stadol; Toradol; Ultram; Vistaril; and Zofran  Home Medications   Current Outpatient Rx  Name  Route  Sig  Dispense  Refill  . esomeprazole (NEXIUM) 20 MG capsule   Oral   Take 20 mg by mouth daily as needed (heartburn).          Marland Kitchen HYDROcodone-acetaminophen (LORTAB) 7.5-325 MG per tablet   Oral   Take 1 tablet by mouth every 6 (six) hours as needed for pain.   20 tablet   0   . promethazine (PHENERGAN) 25 MG tablet   Oral   Take 25 mg by mouth every 6 (six) hours as needed for nausea.          BP 162/106  Pulse 95  Temp(Src) 98.7 F (37.1 C) (Oral)  Resp 20  Wt 135 lb (61.236 kg)  BMI 28.22 kg/m2  SpO2 96% Physical Exam  Nursing note and vitals reviewed. Constitutional: She is oriented to person, place, and time. She appears well-developed and well-nourished. No distress.  HENT:  Head: Normocephalic and atraumatic.  Mouth/Throat: Oropharynx is clear and moist.  Eyes: Conjunctivae are normal. Pupils are equal, round, and reactive to light. No scleral icterus.  Neck: Neck supple.  Cardiovascular: Normal rate, regular rhythm, normal heart sounds and intact distal pulses.   No murmur heard. Pulmonary/Chest: Effort normal and breath sounds normal. No stridor. No respiratory distress. She has no rales.  Abdominal:  Soft. Bowel sounds are normal. She exhibits no distension. There is tenderness in the left lower quadrant. There is no rigidity, no rebound and no guarding.  Musculoskeletal: Normal range of motion.  Neurological: She is alert and oriented to person, place, and time.  Skin: Skin is warm and dry. No rash noted.  Psychiatric: She has a normal mood and affect. Her behavior is normal.    ED Course  Procedures (including critical care time) Labs Review Labs Reviewed  BASIC METABOLIC PANEL - Abnormal; Notable for the following:    Glucose, Bld 115 (*)    All other components within normal limits  URINALYSIS, ROUTINE W REFLEX MICROSCOPIC - Abnormal;  Notable for the following:    APPearance CLOUDY (*)    Leukocytes, UA MODERATE (*)    All other components within normal limits  URINE MICROSCOPIC-ADD ON - Abnormal; Notable for the following:    Bacteria, UA FEW (*)    All other components within normal limits  URINE CULTURE  CBC WITH DIFFERENTIAL   Imaging Review US Renal  07/14/2013   CLINICAL DATA:  flank pain  EXAM: RENAL/URINARY TRACT ULTRASOUND COMPLETE  COMPARISON:  Ultrasound 07/04/2013  FINDINGS: Right Kidney  Length: 10.1 cm. Previously seen small right renal stone by CT cannot be appreciated by ultrasound. No hydronephrosis. Normal echotexture.  Left Kidney  Length: 10.8 cm. 11 mm stone in the midpole. No hydronephrosis. Normal echotexture.  Bladder  Appears normal for degree of bladder distention.  IMPRESSION: No hydronephrosis or acute findings. Left nephrolithiasis.   Electronically Signed   By: Charlett Nose M.D.   On: 07/14/2013 16:51    EKG Interpretation   None       MDM   1. Renal colic on left side    57 yo female presenting with LLQ pain which she states is from her kidney stones.  "I think one's moved".  She is mildly tender left lower quadrant with no peritoneal signs.  She has been to this emergency department 5 times in the past month and a half for similar complaints. She has not yet followed up with her urologist. All of her imaging shows nephrolithiasis without obstruction. I'm concerned that she may be exhibiting narcotic dependent behavior. However, she has true documented disease, will provide dose of IV Dilaudid pending her workup.  Her urinalysis showed no blood or infection, and her renal function was normal.  Her renal ultrasound showed no signs of obstructing stone.  I have a low suspicion for other acute intra-abdominal process. I do not think she needs further emergent workup. Advised followup with her urologist.  Candyce Churn, MD 07/15/13 [phone]

## 2013-07-14 NOTE — ED Notes (Signed)
Dr. Loretha Stapler notified of patient's pain level.  He is coming to talk to patient after UA results.

## 2013-07-15 LAB — URINE CULTURE

## 2013-08-15 ENCOUNTER — Emergency Department: Payer: Self-pay | Admitting: Emergency Medicine

## 2013-08-15 LAB — CBC WITH DIFFERENTIAL/PLATELET
Basophil %: 1.2 %
Eosinophil #: 0.2 10*3/uL (ref 0.0–0.7)
Eosinophil %: 1.6 %
Lymphocyte #: 2.2 10*3/uL (ref 1.0–3.6)
MCH: 28.3 pg (ref 26.0–34.0)
MCHC: 32.9 g/dL (ref 32.0–36.0)
MCV: 86 fL (ref 80–100)
Monocyte #: 0.9 x10 3/mm (ref 0.2–0.9)
Monocyte %: 7.5 %
Neutrophil #: 8.8 10*3/uL — ABNORMAL HIGH (ref 1.4–6.5)
Platelet: 540 10*3/uL — ABNORMAL HIGH (ref 150–440)
RDW: 14 % (ref 11.5–14.5)

## 2013-08-15 LAB — URINALYSIS, COMPLETE
Bilirubin,UR: NEGATIVE
Blood: NEGATIVE
Ketone: NEGATIVE
Nitrite: NEGATIVE
Protein: NEGATIVE
RBC,UR: 5 /HPF (ref 0–5)
WBC UR: 26 /HPF (ref 0–5)

## 2013-08-15 LAB — COMPREHENSIVE METABOLIC PANEL
Albumin: 3.8 g/dL (ref 3.4–5.0)
Anion Gap: 9 (ref 7–16)
Bilirubin,Total: 0.2 mg/dL (ref 0.2–1.0)
Calcium, Total: 9.5 mg/dL (ref 8.5–10.1)
Chloride: 106 mmol/L (ref 98–107)
EGFR (African American): >60
EGFR (Non-African Amer.): >60
Glucose: 103 mg/dL — ABNORMAL HIGH (ref 65–99)
Potassium: 4.1 mmol/L (ref 3.5–5.1)
SGPT (ALT): 17 U/L (ref 12–78)
Sodium: 138 mmol/L (ref 136–145)

## 2013-08-18 ENCOUNTER — Emergency Department: Payer: Self-pay | Admitting: Emergency Medicine

## 2013-08-18 LAB — COMPREHENSIVE METABOLIC PANEL
Albumin: 3.8 g/dL (ref 3.4–5.0)
Alkaline Phosphatase: 84 U/L
Anion Gap: 6 — ABNORMAL LOW (ref 7–16)
BUN: 15 mg/dL (ref 7–18)
Bilirubin,Total: 0.2 mg/dL (ref 0.2–1.0)
EGFR (African American): >60
EGFR (Non-African Amer.): >60
Glucose: 92 mg/dL (ref 65–99)
Osmolality: 276 (ref 275–301)
Potassium: 4.1 mmol/L (ref 3.5–5.1)
SGPT (ALT): 17 U/L (ref 12–78)
Sodium: 138 mmol/L (ref 136–145)
Total Protein: 7.6 g/dL (ref 6.4–8.2)

## 2013-08-18 LAB — URINALYSIS, COMPLETE
Bacteria: NONE SEEN
Glucose,UR: NEGATIVE mg/dL (ref 0–75)
RBC,UR: 3 /HPF (ref 0–5)
Specific Gravity: 1.019 (ref 1.003–1.030)
Squamous Epithelial: 2
WBC UR: 7 /HPF (ref 0–5)

## 2013-08-18 LAB — CBC
HCT: 37.3 % (ref 35.0–47.0)
HGB: 12.5 g/dL (ref 12.0–16.0)
MCH: 28.6 pg (ref 26.0–34.0)
MCHC: 33.5 g/dL (ref 32.0–36.0)
MCV: 85 fL (ref 80–100)
Platelet: 491 10*3/uL — ABNORMAL HIGH (ref 150–440)
RDW: 13.8 % (ref 11.5–14.5)

## 2013-09-05 ENCOUNTER — Encounter (HOSPITAL_COMMUNITY): Payer: Self-pay | Admitting: Emergency Medicine

## 2013-09-05 ENCOUNTER — Emergency Department (HOSPITAL_COMMUNITY)
Admission: EM | Admit: 2013-09-05 | Discharge: 2013-09-06 | Disposition: A | Discharge status: Home / Self Care | Payer: Self-pay | Attending: Emergency Medicine | Admitting: Emergency Medicine

## 2013-09-05 DIAGNOSIS — Z90710 Acquired absence of both cervix and uterus: Secondary | ICD-10-CM | POA: Insufficient documentation

## 2013-09-05 DIAGNOSIS — Z880 Allergy status to penicillin: Secondary | ICD-10-CM | POA: Insufficient documentation

## 2013-09-05 DIAGNOSIS — Z9889 Other specified postprocedural states: Secondary | ICD-10-CM | POA: Insufficient documentation

## 2013-09-05 DIAGNOSIS — E119 Type 2 diabetes mellitus without complications: Secondary | ICD-10-CM | POA: Insufficient documentation

## 2013-09-05 DIAGNOSIS — Z87828 Personal history of other (healed) physical injury and trauma: Secondary | ICD-10-CM | POA: Insufficient documentation

## 2013-09-05 DIAGNOSIS — N390 Urinary tract infection, site not specified: Secondary | ICD-10-CM | POA: Insufficient documentation

## 2013-09-05 DIAGNOSIS — Z792 Long term (current) use of antibiotics: Secondary | ICD-10-CM | POA: Insufficient documentation

## 2013-09-05 DIAGNOSIS — Z9089 Acquired absence of other organs: Secondary | ICD-10-CM | POA: Insufficient documentation

## 2013-09-05 DIAGNOSIS — Z87442 Personal history of urinary calculi: Secondary | ICD-10-CM | POA: Insufficient documentation

## 2013-09-05 LAB — BASIC METABOLIC PANEL
Calcium: 9.6 mg/dL (ref 8.4–10.5)
GFR calc Af Amer: >90 mL/min (ref >90)
GFR calc non Af Amer: >90 mL/min (ref >90)
Glucose, Bld: 108 mg/dL — ABNORMAL HIGH (ref 70–99)
Sodium: 137 mEq/L (ref 135–145)

## 2013-09-05 MED ORDER — PROMETHAZINE HCL 25 MG/ML IJ SOLN
25.0000 mg | Freq: Once | INTRAMUSCULAR | Status: AC
  Administered 2013-09-05: 25 mg via INTRAVENOUS
  Filled 2013-09-05: qty 1

## 2013-09-05 MED ORDER — HYDROMORPHONE HCL PF 1 MG/ML IJ SOLN
1.0000 mg | Freq: Once | INTRAMUSCULAR | Status: AC
  Administered 2013-09-05: 1 mg via INTRAVENOUS

## 2013-09-05 MED ORDER — PROMETHAZINE HCL 25 MG/ML IJ SOLN: 25.0000 mg | Freq: Four times a day (QID) | INTRAMUSCULAR | Status: DC | PRN

## 2013-09-05 MED ORDER — HYDROMORPHONE HCL PF 1 MG/ML IJ SOLN
1.0000 mg | Freq: Once | INTRAMUSCULAR | Status: DC
  Filled 2013-09-05: qty 1

## 2013-09-05 NOTE — ED Provider Notes (Signed)
CSN: [id number]     Arrival date & time 09/05/13  2242 History   First MD Initiated Contact with Patient 09/05/13 2256     Chief Complaint  Patient presents with  . Flank Pain   (Consider location/radiation/quality/duration/timing/severity/associated sxs/prior Treatment) HPI Comments: Pt is a 57 y/o female with hx of kidney stones - has had multiple stones over the years - has primary Uro in [facility] [address] - has had recent lithotripsy and stent placement and since had stent removal.  Now at this time had acute onset of pain in the R flank which is sharp and stabbing, radiates to the RLQ and groin and was associated with hematuria.  According to imaging over the last 2 months (3 [place] and one CT), she had a 2.59mm stone in the R kidney - not seen on [place] after the CT.  She has had several episodes of vomiting - no fevers.  Patient is a 57 y.o. female presenting with flank pain. The history is provided by the patient.  Flank Pain    Past Medical History  Diagnosis Date  . Kidney stone   . Diabetes mellitus without complication   . Rotator cuff tear    Past Surgical History  Procedure Laterality Date  . Back surgery    . Hernia repair    . Abdominal hysterectomy    . Cholecystectomy    . Lithrotripsy    . Hiatal hernia repair      x 6   Family History  Problem Relation Age of Onset  . Diabetes Mother   . Cancer Father   . Diabetes Sister   . Diabetes Brother    History  Substance Use Topics  . Smoking status: Never Smoker   . Smokeless tobacco: Never Used  . Alcohol Use: No   OB History   Grav Para Term Preterm Abortions TAB SAB Ect Mult Living                 Review of Systems  Genitourinary: Positive for flank pain.  All other systems reviewed and are negative.    Allergies  Ceclor; Cinnamon; Demerol; Macrobid; Opium; Reglan; Anzemet; Aspirin; Compazine; Morphine and related; Norflex; Nubain; Penicillins; Percocet; Stadol; Toradol; Ultram; Vistaril; and  Zofran  Home Medications   Current Outpatient Rx  Name  Route  Sig  Dispense  Refill  . esomeprazole (NEXIUM) 20 MG capsule   Oral   Take 20 mg by mouth daily as needed (heartburn).          . ciprofloxacin (CIPRO) 500 MG tablet   Oral   Take 1 tablet (500 mg total) by mouth 2 (two) times daily.   14 tablet   0   . HYDROcodone-acetaminophen (NORCO/VICODIN) 5-325 MG per tablet   Oral   Take 2 tablets by mouth every 4 (four) hours as needed.   10 tablet   0   . promethazine (PHENERGAN) 25 MG suppository   Rectal   Place 1 suppository (25 mg total) rectally every 6 (six) hours as needed for nausea or vomiting.   12 each   0    BP 140/87  Pulse 74  Temp(Src) 98.1 F (36.7 C) (Oral)  Resp 18  SpO2 95% Physical Exam  Nursing note and vitals reviewed. Constitutional: She appears well-developed and well-nourished. No distress.  HENT:  Head: Normocephalic and atraumatic.  Mouth/Throat: Oropharynx is clear and moist. No oropharyngeal exudate.  Eyes: Conjunctivae and EOM are normal. Pupils are equal, round, and  reactive to light. Right eye exhibits no discharge. Left eye exhibits no discharge. No scleral icterus.  Neck: Normal range of motion. Neck supple. No JVD present. No thyromegaly present.  Cardiovascular: Normal rate, regular rhythm, normal heart sounds and intact distal pulses.  Exam reveals no gallop and no friction rub.   No murmur heard. Pulmonary/Chest: Effort normal and breath sounds normal. No respiratory distress. She has no wheezes. She has no rales.  Abdominal: Soft. Bowel sounds are normal. She exhibits no distension and no mass. There is no tenderness.  R CVA ttp mild, no other abd ttp  Musculoskeletal: Normal range of motion. She exhibits no edema and no tenderness.  Lymphadenopathy:    She has no cervical adenopathy.  Neurological: She is alert. Coordination normal.  Skin: Skin is warm and dry. No rash noted. No erythema.  Psychiatric: She has a normal  mood and affect. Her behavior is normal.    ED Course  Procedures (including critical care time) Labs Review Labs Reviewed  BASIC METABOLIC PANEL - Abnormal; Notable for the following:    Glucose, Bld 108 (*)    All other components within normal limits  URINALYSIS, ROUTINE W REFLEX MICROSCOPIC - Abnormal; Notable for the following:    Leukocytes, UA LARGE (*)    All other components within normal limits  URINE MICROSCOPIC-ADD ON - Abnormal; Notable for the following:    Squamous Epithelial / LPF FEW (*)    All other components within normal limits   Imaging Review No results found.  EKG Interpretation   None       MDM   1. UTI (lower urinary tract infection)    KS possible, check UA, pain control.  Angiocath insertion Performed by: Vida Roller  Consent: Verbal consent obtained. Risks and benefits: risks, benefits and alternatives were discussed Time out: Immediately prior to procedure a "time out" was called to verify the correct patient, procedure, equipment, support staff and site/side marked as required.  Preparation: Patient was prepped and draped in the usual sterile fashion.  Vein Location: R EJ  Not Ultrasound Guided  Gauge: 20  Normal blood return and flush without difficulty Patient tolerance: Patient tolerated the procedure well with no immediate complications.   Patient reexamined, states that she has some improvement in her pain, she does not appear to have a colicky type pain as I would suspect if this was ureterolithiasis, in addition the prior CT scan showed that there was only a very small stone in the right kidney making a repeat CT scan today unnecessary. Furthermore her urinalysis does not reveal hematuria but does show leukocytosis, will add antibiotics for possible infection, she has had IV fluids, pain medication with some improvement and states that she can take hydrocodone safely though she has allergies to every other opiate medication  ever created   The patient has been given hydromorphone, she has been given Phenergan, she has also been given ciprofloxacin for a likely infection. She has improved, she has normal vital signs, including no fever tachycardia or hypotension. Again she has a very soft abdomen with no guarding, stable for discharge on medications. Patient is agreeable to the plan.   Meds given in ED:  Medications  ciprofloxacin (CIPRO) IVPB 400 mg (400 mg Intravenous New Bag/Given 09/06/13 0048)  promethazine (PHENERGAN) injection 25 mg (25 mg Intravenous Given 09/05/13 2326)  HYDROmorphone (DILAUDID) injection 1 mg (1 mg Intravenous Given 09/05/13 2331)  HYDROcodone-acetaminophen (NORCO/VICODIN) 5-325 MG per tablet 2 tablet (2 tablets Oral  Given 09/06/13 0047)    New Prescriptions   CIPROFLOXACIN (CIPRO) 500 MG TABLET    Take 1 tablet (500 mg total) by mouth 2 (two) times daily.   HYDROCODONE-ACETAMINOPHEN (NORCO/VICODIN) 5-325 MG PER TABLET    Take 2 tablets by mouth every 4 (four) hours as needed.   PROMETHAZINE (PHENERGAN) 25 MG SUPPOSITORY    Place 1 suppository (25 mg total) rectally every 6 (six) hours as needed for nausea or vomiting.      Vida Roller, MD 09/06/13 [phone]

## 2013-09-05 NOTE — ED Notes (Signed)
Pt complains of flank pain since 8pm, hx of kidney stones

## 2013-09-06 ENCOUNTER — Emergency Department (HOSPITAL_COMMUNITY): Payer: Self-pay

## 2013-09-06 LAB — URINE MICROSCOPIC-ADD ON

## 2013-09-06 LAB — URINALYSIS, ROUTINE W REFLEX MICROSCOPIC
Hgb urine dipstick: NEGATIVE
Nitrite: NEGATIVE
Protein, ur: NEGATIVE mg/dL
Urobilinogen, UA: 0.2 mg/dL (ref 0.0–1.0)

## 2013-09-06 MED ORDER — CIPROFLOXACIN IN D5W 400 MG/200ML IV SOLN
400.0000 mg | Freq: Once | INTRAVENOUS | Status: AC
  Administered 2013-09-06: 400 mg via INTRAVENOUS
  Filled 2013-09-06 (×2): qty 200

## 2013-09-06 MED ORDER — HYDROCODONE-ACETAMINOPHEN 5-325 MG PO TABS
2.0000 | ORAL_TABLET | Freq: Once | ORAL | Status: AC
  Administered 2013-09-06: 2 via ORAL
  Filled 2013-09-06: qty 2

## 2013-09-06 MED ORDER — PROMETHAZINE HCL 25 MG PO TABS: 25.0000 mg | ORAL_TABLET | Freq: Four times a day (QID) | ORAL | Status: DC | PRN

## 2013-09-06 MED ORDER — HYDROCODONE-ACETAMINOPHEN 5-325 MG PO TABS: 2.0000 | ORAL_TABLET | ORAL | Status: DC | PRN

## 2013-09-06 MED ORDER — PROMETHAZINE HCL 25 MG RE SUPP: 25.0000 mg | Freq: Four times a day (QID) | RECTAL | Status: DC | PRN

## 2013-09-06 MED ORDER — CIPROFLOXACIN HCL 500 MG PO TABS: 500.0000 mg | ORAL_TABLET | Freq: Two times a day (BID) | ORAL | Status: DC

## 2013-09-06 NOTE — ED Notes (Signed)
Dr. Hyacinth Meeker made aware of pt's pain.

## 2013-09-18 ENCOUNTER — Emergency Department (HOSPITAL_COMMUNITY): Payer: Self-pay

## 2013-09-18 ENCOUNTER — Encounter (HOSPITAL_COMMUNITY): Payer: Self-pay | Admitting: Emergency Medicine

## 2013-09-18 ENCOUNTER — Emergency Department (HOSPITAL_COMMUNITY)
Admission: EM | Admit: 2013-09-18 | Discharge: 2013-09-18 | Disposition: A | Discharge status: Home / Self Care | Payer: Self-pay | Attending: Emergency Medicine | Admitting: Emergency Medicine

## 2013-09-18 DIAGNOSIS — R319 Hematuria, unspecified: Secondary | ICD-10-CM | POA: Insufficient documentation

## 2013-09-18 DIAGNOSIS — E119 Type 2 diabetes mellitus without complications: Secondary | ICD-10-CM | POA: Insufficient documentation

## 2013-09-18 DIAGNOSIS — R109 Unspecified abdominal pain: Secondary | ICD-10-CM | POA: Insufficient documentation

## 2013-09-18 DIAGNOSIS — Z9089 Acquired absence of other organs: Secondary | ICD-10-CM | POA: Insufficient documentation

## 2013-09-18 DIAGNOSIS — Z8739 Personal history of other diseases of the musculoskeletal system and connective tissue: Secondary | ICD-10-CM | POA: Insufficient documentation

## 2013-09-18 DIAGNOSIS — Z90710 Acquired absence of both cervix and uterus: Secondary | ICD-10-CM | POA: Insufficient documentation

## 2013-09-18 DIAGNOSIS — R3915 Urgency of urination: Secondary | ICD-10-CM | POA: Insufficient documentation

## 2013-09-18 DIAGNOSIS — Z79899 Other long term (current) drug therapy: Secondary | ICD-10-CM | POA: Insufficient documentation

## 2013-09-18 DIAGNOSIS — Z880 Allergy status to penicillin: Secondary | ICD-10-CM | POA: Insufficient documentation

## 2013-09-18 DIAGNOSIS — Z87442 Personal history of urinary calculi: Secondary | ICD-10-CM | POA: Insufficient documentation

## 2013-09-18 DIAGNOSIS — R112 Nausea with vomiting, unspecified: Secondary | ICD-10-CM | POA: Insufficient documentation

## 2013-09-18 DIAGNOSIS — Z9889 Other specified postprocedural states: Secondary | ICD-10-CM | POA: Insufficient documentation

## 2013-09-18 LAB — BASIC METABOLIC PANEL
CO2: 21 mEq/L (ref 19–32)
Calcium: 9.4 mg/dL (ref 8.4–10.5)
Chloride: 104 mEq/L (ref 96–112)
Creatinine, Ser: 0.53 mg/dL (ref 0.50–1.10)
Glucose, Bld: 100 mg/dL — ABNORMAL HIGH (ref 70–99)

## 2013-09-18 LAB — CBC
HCT: 39.1 % (ref 36.0–46.0)
Hemoglobin: 12.7 g/dL (ref 12.0–15.0)
MCH: 28.5 pg (ref 26.0–34.0)
MCV: 87.7 fL (ref 78.0–100.0)
RBC: 4.46 MIL/uL (ref 3.87–5.11)
WBC: 10.3 10*3/uL (ref 4.0–10.5)

## 2013-09-18 LAB — URINALYSIS, ROUTINE W REFLEX MICROSCOPIC
Glucose, UA: NEGATIVE mg/dL
Ketones, ur: NEGATIVE mg/dL
Nitrite: NEGATIVE
Specific Gravity, Urine: 1.026 (ref 1.005–1.030)
pH: 6 (ref 5.0–8.0)

## 2013-09-18 LAB — URINE MICROSCOPIC-ADD ON

## 2013-09-18 MED ORDER — HYDROMORPHONE HCL PF 1 MG/ML IJ SOLN
1.0000 mg | Freq: Once | INTRAMUSCULAR | Status: AC
  Administered 2013-09-18: 1 mg via INTRAVENOUS
  Filled 2013-09-18: qty 1

## 2013-09-18 MED ORDER — HYDROCODONE-ACETAMINOPHEN 5-325 MG PO TABS: 1.0000 | ORAL_TABLET | Freq: Four times a day (QID) | ORAL | Status: AC | PRN

## 2013-09-18 MED ORDER — PROMETHAZINE HCL 25 MG PO TABS: 25.0000 mg | ORAL_TABLET | Freq: Four times a day (QID) | ORAL | Status: DC | PRN

## 2013-09-18 MED ORDER — PROMETHAZINE HCL 25 MG/ML IJ SOLN
25.0000 mg | Freq: Once | INTRAMUSCULAR | Status: AC
  Administered 2013-09-18: 25 mg via INTRAVENOUS
  Filled 2013-09-18: qty 1

## 2013-09-18 NOTE — ED Provider Notes (Signed)
Medical screening examination/treatment/procedure(s) were performed by non-physician practitioner and as supervising physician I was immediately available for consultation/collaboration.   Celene Kras, MD 09/18/13 [phone]

## 2013-09-18 NOTE — ED Notes (Signed)
Pt c/o right sided flank pain and hematuria starting last night; pt sts hx of kidney stone and feels similar

## 2013-09-18 NOTE — ED Notes (Signed)
Pt denies any relief to prior pain medicine. Pt given crackers and ginger ale.

## 2013-09-18 NOTE — ED Provider Notes (Signed)
CSN: [id number]     Arrival date & time 09/18/13  1547 History   First MD Initiated Contact with Patient 09/18/13 1820     Chief Complaint  Patient presents with  . Flank Pain   (Consider location/radiation/quality/duration/timing/severity/associated sxs/prior Treatment) The history is provided by the patient and medical records. No language interpreter was used.    Ana Jarvis is a 57 y.o. female  with a hx of NIDDM, kidney stones presents to the Emergency Department complaining of gradual, persistent, progressively worsening R flank pain onset lat night. Associated symptoms include nausea and vomiting this afternoon.  She reports sharp right-sided flank pain that radiates down into her groin. She also states she had several bouts of hematuria today.  Nothing makes it better and nothing makes it worse.  Pt denies fever, chills, headache, neck pain, chest pain, shortness of breath, weakness, dizziness, syncope.   Past Medical History  Diagnosis Date  . Kidney stone   . Diabetes mellitus without complication   . Rotator cuff tear    Past Surgical History  Procedure Laterality Date  . Back surgery    . Hernia repair    . Abdominal hysterectomy    . Cholecystectomy    . Lithrotripsy    . Hiatal hernia repair      x 6   Family History  Problem Relation Age of Onset  . Diabetes Mother   . Cancer Father   . Diabetes Sister   . Diabetes Brother    History  Substance Use Topics  . Smoking status: Never Smoker   . Smokeless tobacco: Never Used  . Alcohol Use: No   OB History   Grav Para Term Preterm Abortions TAB SAB Ect Mult Living                 Review of Systems  Constitutional: Negative for fever, diaphoresis, appetite change and fatigue.  Respiratory: Negative for shortness of breath.   Cardiovascular: Negative for chest pain.  Gastrointestinal: Positive for nausea and vomiting. Negative for abdominal pain, diarrhea, constipation and blood in stool.   Genitourinary: Positive for urgency, hematuria and flank pain. Negative for dysuria, frequency, vaginal bleeding, vaginal discharge and difficulty urinating.  Musculoskeletal: Negative for back pain.  Skin: Negative for rash.  Neurological: Negative for headaches.  All other systems reviewed and are negative.    Allergies  Ceclor; Cinnamon; Demerol; Macrobid; Opium; Reglan; Anzemet; Aspirin; Compazine; Morphine and related; Norflex; Nubain; Penicillins; Percocet; Stadol; Toradol; Ultram; Vistaril; and Zofran  Home Medications   Current Outpatient Rx  Name  Route  Sig  Dispense  Refill  . Esomeprazole Magnesium (NEXIUM PO)   Oral   Take 22.3 mg by mouth daily. *otc strength nexium*         . promethazine (PHENERGAN) 25 MG tablet   Oral   Take 25 mg by mouth every 6 (six) hours as needed for nausea or vomiting.         Marland Kitchen HYDROcodone-acetaminophen (NORCO/VICODIN) 5-325 MG per tablet   Oral   Take 1-2 tablets by mouth every 6 (six) hours as needed.   8 tablet   0   . promethazine (PHENERGAN) 25 MG tablet   Oral   Take 1 tablet (25 mg total) by mouth every 6 (six) hours as needed for nausea or vomiting.   12 tablet   0    BP 141/88  Pulse 92  Temp(Src) 99.1 F (37.3 C) (Oral)  Resp 18  Wt 147 lb 3.2  oz (66.769 kg)  SpO2 93% Physical Exam  Nursing note and vitals reviewed. Constitutional: She is oriented to person, place, and time. She appears well-developed and well-nourished. No distress.  HENT:  Head: Normocephalic and atraumatic.  Mouth/Throat: Oropharynx is clear and moist. No oropharyngeal exudate.  Eyes: Conjunctivae are normal. Pupils are equal, round, and reactive to light.  Neck: Normal range of motion. Neck supple.  Full ROM without pain  Cardiovascular: Normal rate, regular rhythm, normal heart sounds and intact distal pulses.   No murmur heard. No tachycardia  Pulmonary/Chest: Effort normal and breath sounds normal. No respiratory distress. She has  no wheezes.  Abdominal: Soft. Bowel sounds are normal. She exhibits no distension and no mass. There is no tenderness. There is CVA tenderness (right). There is no rebound and no guarding.  Patient was soft and nontender abdomen Right flank pain and right CVA tenderness  Musculoskeletal: Normal range of motion. She exhibits no edema.  Full range of motion of the T-spine and L-spine No tenderness to palpation of the spinous processes of the T-spine or L-spine Mild tenderness to palpation of the right paraspinous muscles of the L-spine  Lymphadenopathy:    She has no cervical adenopathy.  Neurological: She is alert and oriented to person, place, and time. She has normal reflexes. She exhibits normal muscle tone. Coordination normal.  Speech is clear and goal oriented, follows commands Normal strength in upper and lower extremities bilaterally including dorsiflexion and plantar flexion, strong and equal grip strength Sensation normal to light and sharp touch Moves extremities without ataxia, coordination intact Normal gait Normal balance   Skin: Skin is warm and dry. No rash noted. She is not diaphoretic. No erythema.  Psychiatric: She has a normal mood and affect.    ED Course  Procedures (including critical care time) Labs Review Labs Reviewed  URINALYSIS, ROUTINE W REFLEX MICROSCOPIC - Abnormal; Notable for the following:    Leukocytes, UA SMALL (*)    All other components within normal limits  URINE MICROSCOPIC-ADD ON - Abnormal; Notable for the following:    Squamous Epithelial / LPF MANY (*)    All other components within normal limits  BASIC METABOLIC PANEL - Abnormal; Notable for the following:    Glucose, Bld 100 (*)    All other components within normal limits  URINE CULTURE  CBC   Imaging Review Ct Abdomen Pelvis Wo Contrast  09/18/2013   CLINICAL DATA:  Flank pain. Kidney stone. Right flank pain since 1 o'clock today.  EXAM: CT ABDOMEN AND PELVIS WITHOUT CONTRAST   TECHNIQUE: Multidetector CT imaging of the abdomen and pelvis was performed following the standard protocol without intravenous contrast.  COMPARISON:  08/29/2013.  FINDINGS: Lung Bases: Atelectasis in the left lower lobe. Stable pulmonary nodule in the left lower lobe (image 5 series 3). Continued follow-up recommended. This is stable compared to 06/08/2013.  Liver: Unenhanced CT was performed per clinician order. Lack of IV contrast limits sensitivity and specificity, especially for evaluation of abdominal/pelvic solid viscera. Normal.  Spleen:  Normal.  Gallbladder:  Cholecystectomy.  Common bile duct:  Grossly normal.  Pancreas:  Normal.  Adrenal glands:  Normal bilaterally.  Kidneys: Left kidney and ureter appear normal. Nonobstructing right upper pole renal collecting system calculus measuring 2 mm. The right ureter appears normal.  Stomach: Intra thoracic stomach is present. Surgical clips are present around the stomach suggesting gastric pull-through or attempted hiatal hernia repair. No inflammatory changes of stomach. No change in configuration compared to  prior radiographs.  Small bowel:  Normal.  No obstruction or inflammatory changes.  Colon: Appendix not identified. No right lower quadrant inflammatory changes. Surgical clips in the anatomic pelvis.  Pelvic Genitourinary: Partially collapsed. No free fluid. Hysterectomy. No adenopathy.  Bones: No aggressive osseous lesions. Grade I retrolisthesis of L3 on L4 associated with collapse of the disc space. T12 vertebral body hemangioma.  Vasculature: Mild atherosclerosis. No gross acute vascular abnormality.  Body Wall: Scarring in the gluteal subcutaneous fat compatible with injection granuloma.  IMPRESSION: 1. No acute abnormality or interval change. 2. Tiny nonobstructing right renal collecting system calculi. No ureteral calculi. 3. Cholecystectomy, hysterectomy, and postsurgical changes in the upper abdomen with intra thoracic stomach, unchanged. 4.  Short-term stability of left lower lobe 4 mm pulmonary nodule. Follow-up recommendations on CT 08/15/2013.   Electronically Signed   By: Andreas Newport M.D.   On: 09/18/2013 19:25    EKG Interpretation   None       Angiocath insertion Performed by: Dierdre Forth  Consent: Verbal consent obtained. Risks and benefits: risks, benefits and alternatives were discussed Time out: Immediately prior to procedure a "time out" was called to verify the correct patient, procedure, equipment, support staff and site/side marked as required.  Preparation: Patient was prepped and draped in the usual sterile fashion.  Vein Location: right EJ  Not Ultrasound Guided  Gauge: 18ga  Normal blood return and flush without difficulty Patient tolerance: Patient tolerated the procedure well with no immediate complications.     MDM   1. Right flank pain      Ana Jarvis presents with right flank pain.  Record review shows the patient was seen on 09/05/2013 with the same symptoms. At that time she was diagnosed with a UTI based on increased white blood cells in her urine. She states after her antibiotic she felt some better but pain returned last night. His a history of kidney stones and states the pain feels the same.  Last CT scan was in Oct 2014 and showed bilateral nephrolithiasis.  Pt without hematuria on microscopic exam.  Will give pain medicine, antiemetic and repeat imaging.    7:42 PM CBC unremarkable, CT abdomen pelvis with tiny nonobstructing right renal collecting system calculi but no ureteral calculi.  No hydronephrosis. BMP pending.  8:06 PM BMP with normal BUN and creatinine.  Patient pain not necessarily from the calculi but possibly from musculoskeletal low-back pain as patient is tender along the right L-spine paraspinal muscles as well as the right flank.  No red flags for back pain; but she does have a history of back surgery many years ago, fibromyalgia and chronic back  pain.  9:03 PM Patient given Dilaudid x3 here with resolution of pain.   Patient has passed by mouth trial. No emesis here in the department.  Her vitals are stable.  She ambulates without difficulty or gait disturbance. Recommend followup with her primary care physician and urologist.  It has been determined that no acute conditions requiring further emergency intervention are present at this time. The patient/guardian have been advised of the diagnosis and plan. We have discussed signs and symptoms that warrant return to the ED, such as changes or worsening in symptoms.   Vital signs are stable at discharge.   BP 141/88  Pulse 92  Temp(Src) 99.1 F (37.3 C) (Oral)  Resp 18  Wt 147 lb 3.2 oz (66.769 kg)  SpO2 93%  Patient/guardian has voiced understanding and agreed to follow-up with the  PCP or specialist.      Dierdre Forth, PA-C 09/18/13 2137

## 2013-09-18 NOTE — ED Notes (Signed)
Pt able to keep food and fluids down with no issues.

## 2013-09-20 LAB — URINE CULTURE

## 2013-09-23 ENCOUNTER — Emergency Department (HOSPITAL_COMMUNITY): Payer: Self-pay

## 2013-09-23 ENCOUNTER — Encounter (HOSPITAL_COMMUNITY): Payer: Self-pay | Admitting: Emergency Medicine

## 2013-09-23 ENCOUNTER — Emergency Department (HOSPITAL_COMMUNITY)
Admission: EM | Admit: 2013-09-23 | Discharge: 2013-09-23 | Disposition: A | Discharge status: Home / Self Care | Payer: Self-pay | Attending: Emergency Medicine | Admitting: Emergency Medicine

## 2013-09-23 DIAGNOSIS — Z880 Allergy status to penicillin: Secondary | ICD-10-CM | POA: Insufficient documentation

## 2013-09-23 DIAGNOSIS — E119 Type 2 diabetes mellitus without complications: Secondary | ICD-10-CM | POA: Insufficient documentation

## 2013-09-23 DIAGNOSIS — Z87442 Personal history of urinary calculi: Secondary | ICD-10-CM | POA: Insufficient documentation

## 2013-09-23 DIAGNOSIS — Z9089 Acquired absence of other organs: Secondary | ICD-10-CM | POA: Insufficient documentation

## 2013-09-23 DIAGNOSIS — Z79899 Other long term (current) drug therapy: Secondary | ICD-10-CM | POA: Insufficient documentation

## 2013-09-23 DIAGNOSIS — M545 Low back pain, unspecified: Secondary | ICD-10-CM | POA: Insufficient documentation

## 2013-09-23 DIAGNOSIS — Z9889 Other specified postprocedural states: Secondary | ICD-10-CM | POA: Insufficient documentation

## 2013-09-23 DIAGNOSIS — G8929 Other chronic pain: Secondary | ICD-10-CM | POA: Insufficient documentation

## 2013-09-23 DIAGNOSIS — R109 Unspecified abdominal pain: Secondary | ICD-10-CM | POA: Insufficient documentation

## 2013-09-23 DIAGNOSIS — Z8719 Personal history of other diseases of the digestive system: Secondary | ICD-10-CM | POA: Insufficient documentation

## 2013-09-23 HISTORY — DX: Other intervertebral disc degeneration, lumbar region without mention of lumbar back pain or lower extremity pain: M51.369

## 2013-09-23 HISTORY — DX: Other chronic pain: G89.29

## 2013-09-23 HISTORY — DX: Dorsalgia, unspecified: M54.9

## 2013-09-23 HISTORY — DX: Other intervertebral disc degeneration, lumbar region: M51.36

## 2013-09-23 HISTORY — DX: Diaphragmatic hernia without obstruction or gangrene: K44.9

## 2013-09-23 HISTORY — DX: Unspecified abdominal pain: R10.9

## 2013-09-23 LAB — URINALYSIS, ROUTINE W REFLEX MICROSCOPIC
Bilirubin Urine: NEGATIVE
Glucose, UA: NEGATIVE mg/dL
Hgb urine dipstick: NEGATIVE
Ketones, ur: NEGATIVE mg/dL
Protein, ur: NEGATIVE mg/dL
pH: 7 (ref 5.0–8.0)

## 2013-09-23 LAB — URINE MICROSCOPIC-ADD ON

## 2013-09-23 MED ORDER — HYDROCODONE-ACETAMINOPHEN 5-325 MG PO TABS
2.0000 | ORAL_TABLET | Freq: Once | ORAL | Status: AC
  Administered 2013-09-23: 2 via ORAL
  Filled 2013-09-23: qty 2

## 2013-09-23 MED ORDER — DIAZEPAM 5 MG PO TABS
5.0000 mg | ORAL_TABLET | Freq: Once | ORAL | Status: AC
  Administered 2013-09-23: 5 mg via ORAL
  Filled 2013-09-23: qty 1

## 2013-09-23 MED ORDER — METHOCARBAMOL 500 MG PO TABS
1000.0000 mg | ORAL_TABLET | Freq: Four times a day (QID) | ORAL | Status: AC | PRN
Start: 2013-09-23 — End: ?

## 2013-09-23 MED ORDER — FENTANYL CITRATE 0.05 MG/ML IJ SOLN: 100.0000 ug | Freq: Once | INTRAMUSCULAR | Status: DC

## 2013-09-23 NOTE — ED Notes (Signed)
RN witness for Dr. Clarene Duke speaking to pt about narcotic prescriptions.

## 2013-09-23 NOTE — ED Provider Notes (Signed)
CSN: [id number]     Arrival date & time 09/23/13  1648 History   First MD Initiated Contact with Patient 09/23/13 1709     Chief Complaint  Patient presents with  . Flank Pain    HPI Pt was seen at 1740. Per pt and her family, c/o gradual onset and persistence of constant acute flair of her chronic right sided low back "pain" for the past several days.  Denies any change in her usual chronic pain pattern.  Pain worsens with palpation of the area and body position changes. States the pain radiates into the right side of her torso and "feels like when I get a kidney stone." Denies incont/retention of bowel or bladder, no saddle anesthesia, no focal motor weakness, no tingling/numbness in extremities, no fevers, no injury, no abd pain.   The symptoms have been associated with no other complaints. The patient has a significant history of similar symptoms previously, recently being evaluated for this complaint and multiple prior evals for same. Pt has had 8 ED evaluations in the past 3 months for same, most recently 5 days ago.     Past Medical History  Diagnosis Date  . Kidney stone   . Diabetes mellitus without complication   . Rotator cuff tear   . DDD (degenerative disc disease), lumbar   . Chronic back pain   . Chronic flank pain   . Hiatal hernia    Past Surgical History  Procedure Laterality Date  . Back surgery    . Hernia repair    . Abdominal hysterectomy    . Cholecystectomy    . Lithrotripsy    . Hiatal hernia repair      x 6   Family History  Problem Relation Age of Onset  . Diabetes Mother   . Cancer Father   . Diabetes Sister   . Diabetes Brother    History  Substance Use Topics  . Smoking status: Never Smoker   . Smokeless tobacco: Never Used  . Alcohol Use: No    Review of Systems ROS: Statement: All systems negative except as marked or noted in the HPI; Constitutional: Negative for fever and chills. ; ; Eyes: Negative for eye pain, redness and discharge. ;  ; ENMT: Negative for ear pain, hoarseness, nasal congestion, sinus pressure and sore throat. ; ; Cardiovascular: Negative for chest pain, palpitations, diaphoresis, dyspnea and peripheral edema. ; ; Respiratory: Negative for cough, wheezing and stridor. ; ; Gastrointestinal: Negative for nausea, vomiting, diarrhea, abdominal pain, blood in stool, hematemesis, jaundice and rectal bleeding. . ; ; Genitourinary: Negative for dysuria, flank pain and hematuria. ; ; Musculoskeletal: +LBP. Negative for neck pain. Negative for swelling and trauma.; ; Skin: Negative for pruritus, rash, abrasions, blisters, bruising and skin lesion.; ; Neuro: Negative for headache, lightheadedness and neck stiffness. Negative for weakness, altered level of consciousness , altered mental status, extremity weakness, paresthesias, involuntary movement, seizure and syncope.      Allergies  Ceclor; Cinnamon; Demerol; Macrobid; Opium; Reglan; Anzemet; Aspirin; Compazine; Morphine and related; Norflex; Nubain; Penicillins; Percocet; Stadol; Toradol; Ultram; Vistaril; and Zofran  Home Medications   Current Outpatient Rx  Name  Route  Sig  Dispense  Refill  . Esomeprazole Magnesium (NEXIUM PO)   Oral   Take 22.3 mg by mouth daily. *otc strength nexium*         . HYDROcodone-acetaminophen (NORCO/VICODIN) 5-325 MG per tablet   Oral   Take 1-2 tablets by mouth every 6 (six) hours as  needed.   8 tablet   0   . promethazine (PHENERGAN) 25 MG tablet   Oral   Take 25 mg by mouth every 6 (six) hours as needed for nausea or vomiting.          BP 144/81  Pulse 87  Temp(Src) 98.4 F (36.9 C) (Oral)  Resp 18  Ht 4\' 10"  (1.473 m)  Wt 149 lb 9.6 oz (67.858 kg)  BMI 31.27 kg/m2  SpO2 98% Physical Exam 1745: Physical examination:  Nursing notes reviewed; Vital signs and O2 SAT reviewed;  Constitutional: Well developed, Well nourished, Well hydrated, In no acute distress; Head:  Normocephalic, atraumatic; Eyes: EOMI, PERRL, No  scleral icterus; ENMT: Mouth and pharynx normal, Mucous membranes moist; Neck: Supple, Full range of motion, No lymphadenopathy; Cardiovascular: Regular rate and rhythm, No gallop; Respiratory: Breath sounds clear & equal bilaterally, No wheezes.  Speaking full sentences with ease, Normal respiratory effort/excursion; Chest: Nontender, Movement normal; Abdomen: Soft, Nontender, Nondistended, Normal bowel sounds; Genitourinary: No CVA tenderness; Spine:  No midline CS, TS, LS tenderness. +TTP right lumbar paraspinal muscles. No rash;; Extremities: Pulses normal, No tenderness, No edema, No calf edema or asymmetry.; Neuro: AA&Ox3, Major CN grossly intact.  Speech clear. Strength 5/5 equal bilat UE's and LE's, including great toe dorsiflexion.  DTR 2/4 equal bilat UE's and LE's.  No gross sensory deficits.  Neg straight leg raises bilat. Climbs on and off stretcher easily by herself. Gait steady..; Skin: Color normal, Warm, Dry.   ED Course  Procedures     EKG Interpretation   None       MDM  MDM Reviewed: previous chart, nursing note and vitals Reviewed previous: labs, CT scan and ultrasound Interpretation: labs and x-ray   Results for orders placed during the hospital encounter of 09/23/13  URINALYSIS, ROUTINE W REFLEX MICROSCOPIC      Result Value Range   Color, Urine YELLOW  YELLOW   APPearance CLEAR  CLEAR   Specific Gravity, Urine 1.005  1.005 - 1.030   pH 7.0  5.0 - 8.0   Glucose, UA NEGATIVE  NEGATIVE mg/dL   Hgb urine dipstick NEGATIVE  NEGATIVE   Bilirubin Urine NEGATIVE  NEGATIVE   Ketones, ur NEGATIVE  NEGATIVE mg/dL   Protein, ur NEGATIVE  NEGATIVE mg/dL   Urobilinogen, UA 0.2  0.0 - 1.0 mg/dL   Nitrite NEGATIVE  NEGATIVE   Leukocytes, UA MODERATE (*) NEGATIVE  URINE MICROSCOPIC-ADD ON      Result Value Range   Squamous Epithelial / LPF FEW (*) RARE   WBC, UA 7-10  <3 WBC/hpf   Bacteria, UA FEW (*) RARE   Ct Abdomen Pelvis Wo Contrast 09/18/2013   CLINICAL DATA:   Flank pain. Kidney stone. Right flank pain since 1 o'clock today.  EXAM: CT ABDOMEN AND PELVIS WITHOUT CONTRAST  TECHNIQUE: Multidetector CT imaging of the abdomen and pelvis was performed following the standard protocol without intravenous contrast.  COMPARISON:  08/29/2013.  FINDINGS: Lung Bases: Atelectasis in the left lower lobe. Stable pulmonary nodule in the left lower lobe (image 5 series 3). Continued follow-up recommended. This is stable compared to 06/08/2013.  Liver: Unenhanced CT was performed per clinician order. Lack of IV contrast limits sensitivity and specificity, especially for evaluation of abdominal/pelvic solid viscera. Normal.  Spleen:  Normal.  Gallbladder:  Cholecystectomy.  Common bile duct:  Grossly normal.  Pancreas:  Normal.  Adrenal glands:  Normal bilaterally.  Kidneys: Left kidney and ureter appear normal. Nonobstructing right  upper pole renal collecting system calculus measuring 2 mm. The right ureter appears normal.  Stomach: Intra thoracic stomach is present. Surgical clips are present around the stomach suggesting gastric pull-through or attempted hiatal hernia repair. No inflammatory changes of stomach. No change in configuration compared to prior radiographs.  Small bowel:  Normal.  No obstruction or inflammatory changes.  Colon: Appendix not identified. No right lower quadrant inflammatory changes. Surgical clips in the anatomic pelvis.  Pelvic Genitourinary: Partially collapsed. No free fluid. Hysterectomy. No adenopathy.  Bones: No aggressive osseous lesions. Grade I retrolisthesis of L3 on L4 associated with collapse of the disc space. T12 vertebral body hemangioma.  Vasculature: Mild atherosclerosis. No gross acute vascular abnormality.  Body Wall: Scarring in the gluteal subcutaneous fat compatible with injection granuloma.  IMPRESSION: 1. No acute abnormality or interval change. 2. Tiny nonobstructing right renal collecting system calculi. No ureteral calculi. 3.  Cholecystectomy, hysterectomy, and postsurgical changes in the upper abdomen with intra thoracic stomach, unchanged. 4. Short-term stability of left lower lobe 4 mm pulmonary nodule. Follow-up recommendations on CT 08/15/2013.   Electronically Signed   By: Andreas [facility] M.D.   On: 09/18/2013 19:25   Dg Abd Acute W/chest 09/23/2013   CLINICAL DATA:  Pain and emesis; diarrhea  EXAM: ACUTE ABDOMEN SERIES (ABDOMEN 2 VIEW & CHEST 1 VIEW)  COMPARISON:  CT abdomen and pelvis September 18, 2013  FINDINGS: PA chest: There is No edema or consolidation. Heart is upper normal in size with normal pulmonary vascularity. No adenopathy. There is a large paraesophageal type hernia.  Supine and upright abdomen: Overall bowel gas pattern is normal. No obstruction or free air. Liver appears enlarged. There are multiple surgical clips throughout the abdomen and pelvis. There are no abnormal calcifications.  IMPRESSION: Large paraesophageal type hernia. Bowel gas pattern unremarkable. Liver is prominent. No edema or consolidation.   Electronically Signed   By: Bretta Bang M.D.   On: 09/23/2013 19:02     1900:  Concerned regarding pt's multiple ED visits for the same pain, without CT scan evidence of ureteral calculi or [place] evidence of hydronephrosis. Care Everywhere records reviewed: unable to access [facility] records at this time, but was able to access [facility] records: in the past 3 months, most recently 09/08/13, pt has visited 2 different [facility] ED's 4 times total for this same complaint, with multiple CT scans negative for ureteral calculi. Very concerned regarding this information. [place] Controlled Substance Database accessed: in the past 3 months, pt has filled a total of 16 hydrocodone 5/APAP 325mg  prescriptions written by 13 different providers [place] from [place] to [place] accessed: in 05/2013 pt had filled a total of 2 hydrocodone 5mg /APAP 325mg   prescriptions written by 2 different providers in [facility] [place].  I am concerned regarding possible narcotic seeking behavior and/or drug diversion. ED RN and I queried pt regarding this information above. Pt only stated she "just wants to go home then." Pt is aware that I will not be rx her narcotic medication today. Verb understanding. Pt encouraged to f/u with her PMD and Pain Management doctor for good continuity of care and control of her chronic pain.  Verb understanding. Udip appears contaminated and pt denies dysuria; UC is pending, pt and family aware. Dx and testing d/w pt and family.  Questions answered.  Verb understanding, agreeable to d/c home with outpt f/u.        Laray Anger, DO 09/26/13 [phone]

## 2013-09-23 NOTE — ED Notes (Signed)
Reports onset of right flank pain early this am, radiates around to right groin. Having urinary frequency. Hx of kidney stones. Was here on 12/23 for same.

## 2013-09-23 NOTE — ED Notes (Signed)
Pt has refused  Blood work- Dr. Clarene Duke made aware.

## 2013-09-26 LAB — URINE CULTURE: Colony Count: 15000

## 2013-09-27 ENCOUNTER — Telehealth (HOSPITAL_COMMUNITY): Payer: Self-pay | Admitting: Emergency Medicine

## 2013-09-27 NOTE — ED Notes (Signed)
Post ED Visit - Positive Culture Follow-up  Culture report reviewed by antimicrobial stewardship pharmacist: []  Wes Dulaney, Pharm.D., BCPS []  Celedonio MiyamotoJeremy Frens, Pharm.D., BCPS []  Georgina PillionElizabeth Martin, Pharm.D., BCPS []  CommodoreMinh Pham, [address] BoulevardPharm.D., BCPS, AAHIVP []  Estella HuskMichelle Turner, Pharm.D., BCPS, AAHIVP [x]  Lysle Pearlachel Rumbarger, Pharm.D., BCPS  Positive urine culture Per Fayrene HelperBowie Tran PA-C, no treatment needed and no further patient follow-up is required at this time.  [place], Jenel LucksKylie 09/27/2013, 1:27 PM

## 2013-11-11 ENCOUNTER — Emergency Department: Payer: Self-pay

## 2013-11-11 LAB — CBC
HCT: 39.5 % (ref 35.0–47.0)
HGB: 12.4 g/dL (ref 12.0–16.0)
MCH: 26.9 pg (ref 26.0–34.0)
MCHC: 31.3 g/dL — ABNORMAL LOW (ref 32.0–36.0)
MCV: 86 fL (ref 80–100)
Platelet: 374 10*3/uL (ref 150–440)
RBC: 4.59 10*6/uL (ref 3.80–5.20)
RDW: 14.1 % (ref 11.5–14.5)
WBC: 8.1 10*3/uL (ref 3.6–11.0)

## 2013-11-11 LAB — URINALYSIS, COMPLETE
Bacteria: NONE SEEN
Bilirubin,UR: NEGATIVE
Blood: NEGATIVE
KETONE: NEGATIVE
NITRITE: NEGATIVE
Ph: 6 (ref 4.5–8.0)
Protein: NEGATIVE
RBC,UR: 1 /HPF (ref 0–5)
SPECIFIC GRAVITY: 1.018 (ref 1.003–1.030)
Squamous Epithelial: 1
WBC UR: 5 /HPF (ref 0–5)

## 2013-11-11 LAB — BASIC METABOLIC PANEL
Anion Gap: 12 (ref 7–16)
BUN: 6 mg/dL — ABNORMAL LOW (ref 7–18)
CALCIUM: 8.7 mg/dL (ref 8.5–10.1)
CREATININE: 1.04 mg/dL (ref 0.60–1.30)
Chloride: 107 mmol/L (ref 98–107)
Co2: 21 mmol/L (ref 21–32)
EGFR (African American): >60
EGFR (Non-African Amer.): >60
Glucose: 205 mg/dL — ABNORMAL HIGH (ref 65–99)
Osmolality: 283 (ref 275–301)
POTASSIUM: 3.3 mmol/L — AB (ref 3.5–5.1)
Sodium: 140 mmol/L (ref 136–145)

## 2014-05-10 IMAGING — US US RENAL
1 series · 14 of 25 positions shown · non-contrast
Comparison: Prior CT abdomen/ pelvis 06/14/2013

CLINICAL DATA: Left flank pain

EXAM:
RENAL/URINARY TRACT ULTRASOUND COMPLETE

[Series 1: us renal · 0.21mm/px · 14 of 33 slices shown]
[im 1/33]
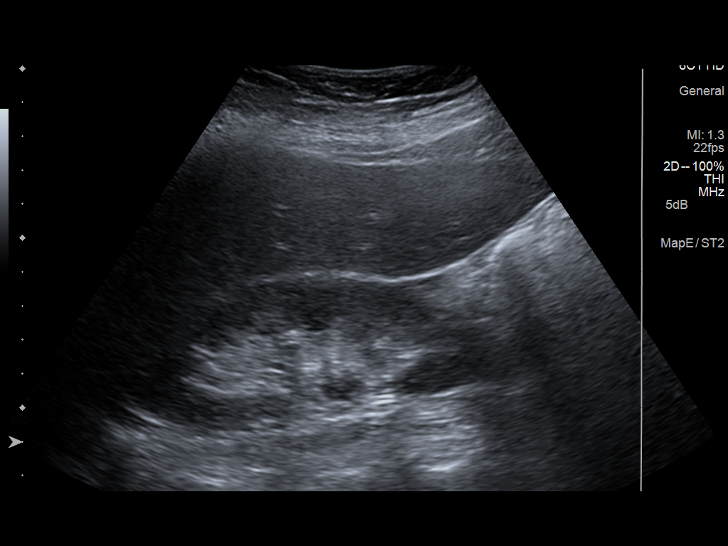
[im 3/33]
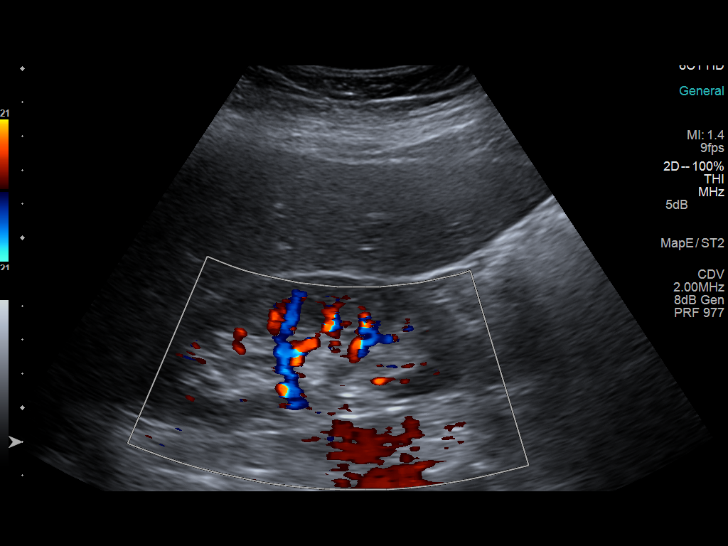
[im 6/33]
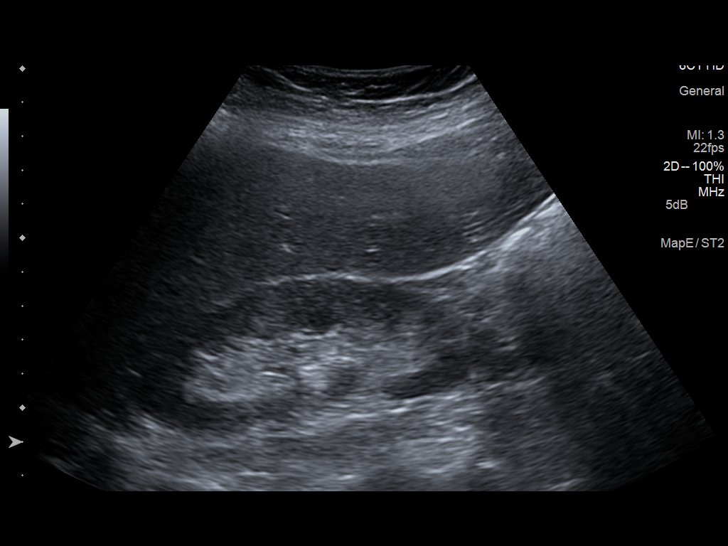
[im 9/33]
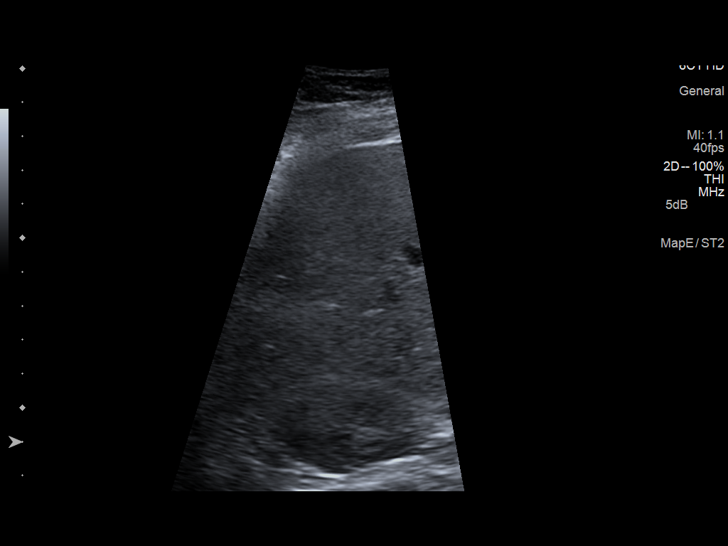
[im 11/33]
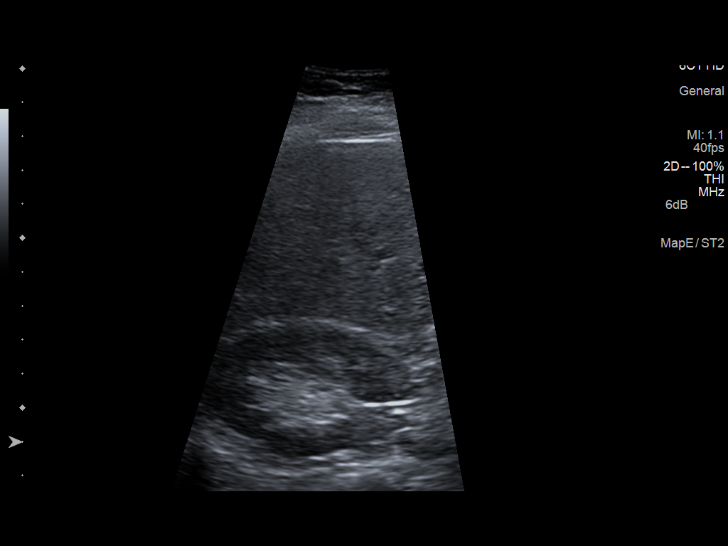
[im 13/33]
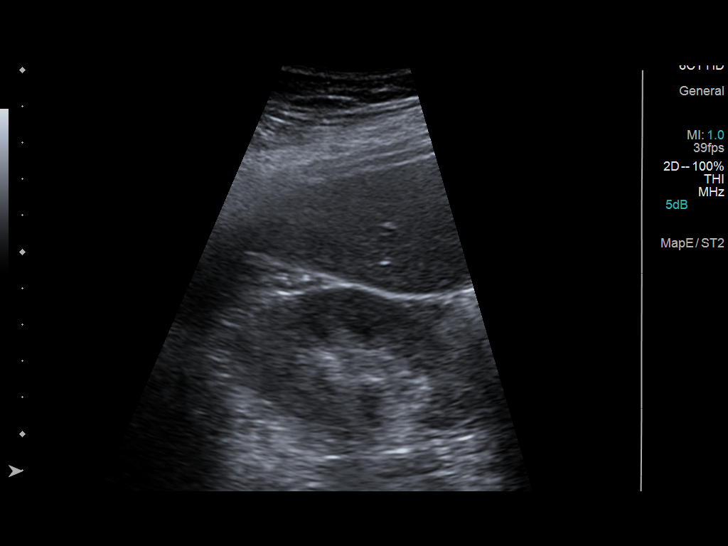
[im 15/33]
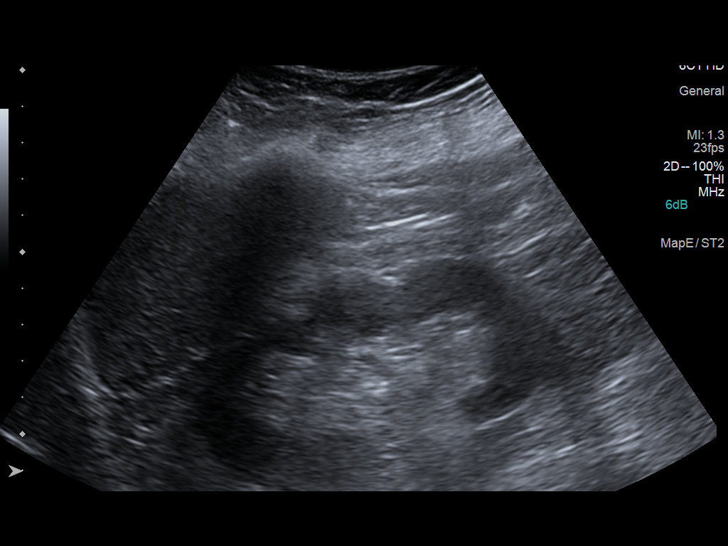
[im 18/33]
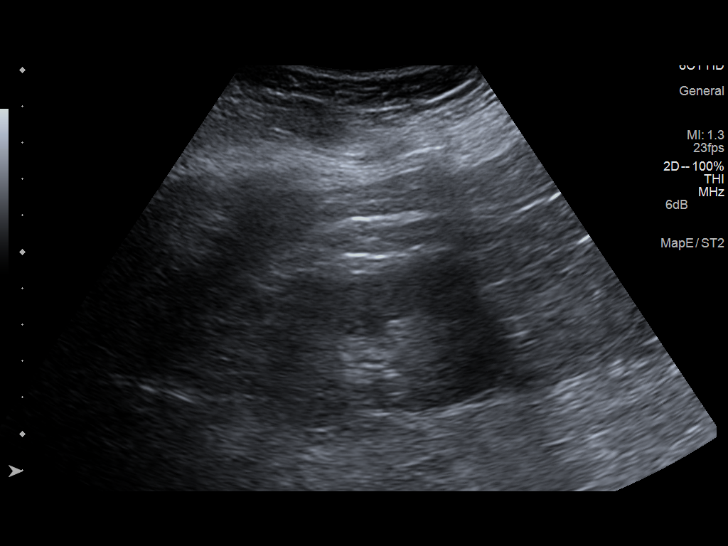
[im 21/33]
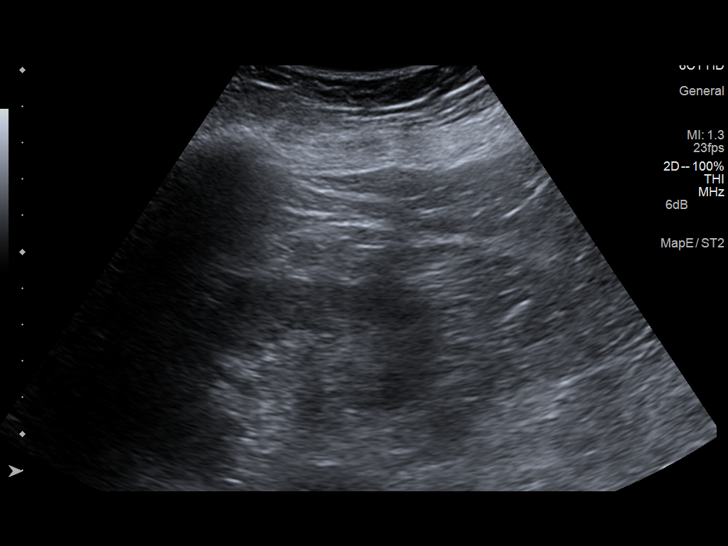
[im 22/33]
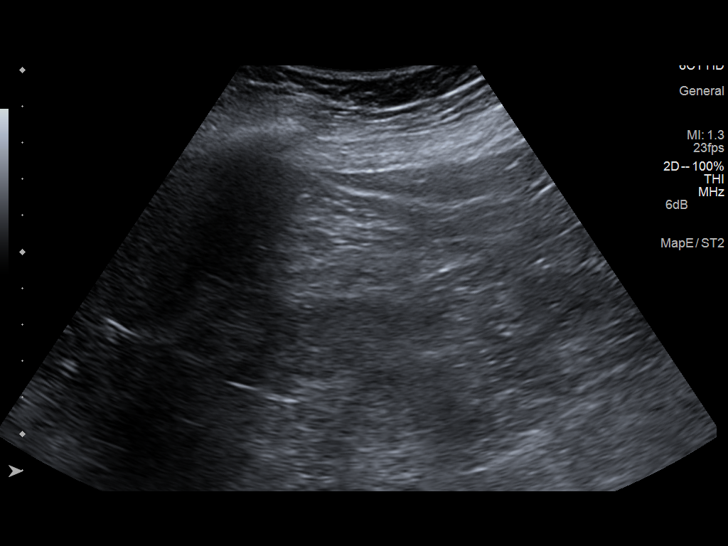
[im 25/33]
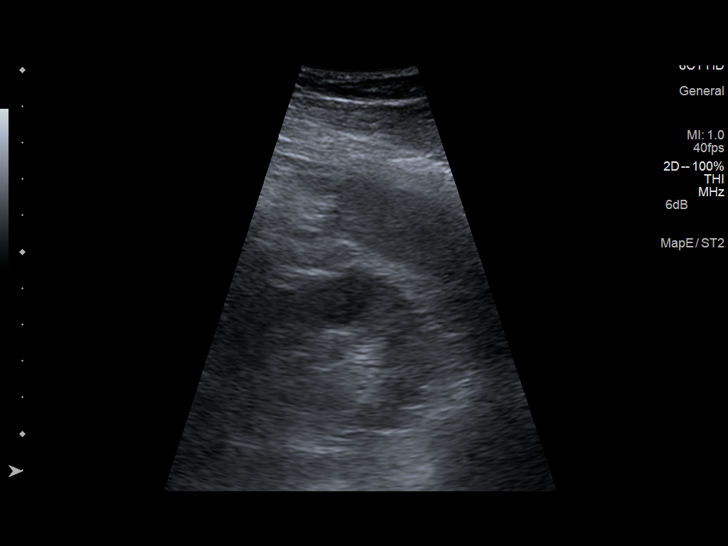
[im 27/33]
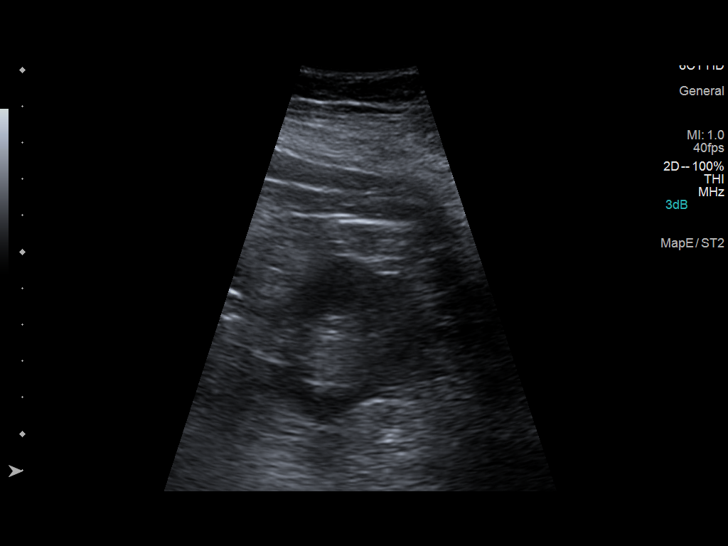
[im 30/33]
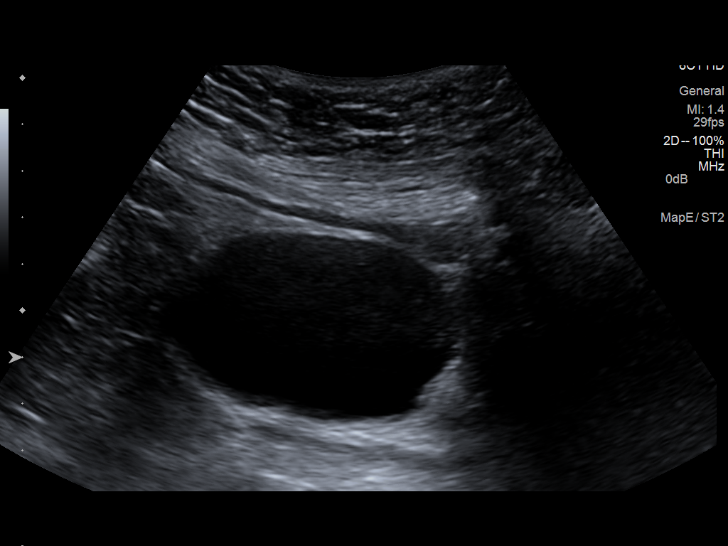
[im 33/33]
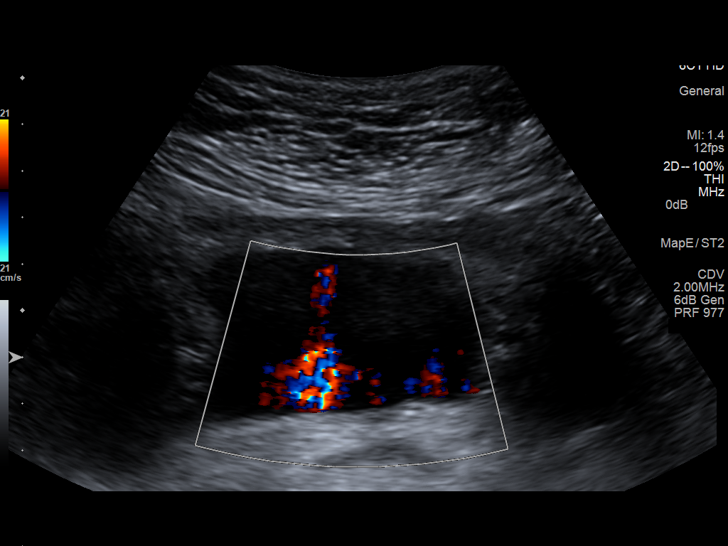

[14 of 25 positions shown; findings below may reference images not displayed]

FINDINGS: Right Kidney

Length: 10.9 cm Echogenicity within normal limits. No mass or
hydronephrosis visualized.

Left Kidney

Length: 10.2 cm normal echogenicity. No hydronephrosis.
Nonobstructing 8.7 mm echogenic focus with posterior acoustic
shadowing consistent with patient's known renal stone.

Bladder: Appears normal for degree of bladder distention. Both
ureteral jets are identified.
IMPRESSION: 1. No hydronephrosis the.
2. Nonobstructing nearly 9 mm stone in the left kidney at the
junction of the upper and interpolar regions. Findings correlate
with prior CT imaging.
3. Bilateral ureteral jets are identified.

## 2014-07-30 IMAGING — CR DG ABDOMEN ACUTE W/ 1V CHEST
3 series · 3 of 3 positions shown · non-contrast
Comparison: CT abdomen and pelvis September 18, 2013

CLINICAL DATA: Pain and emesis; diarrhea

EXAM:
ACUTE ABDOMEN SERIES (ABDOMEN 2 VIEW & CHEST 1 VIEW)

[w chest pa]
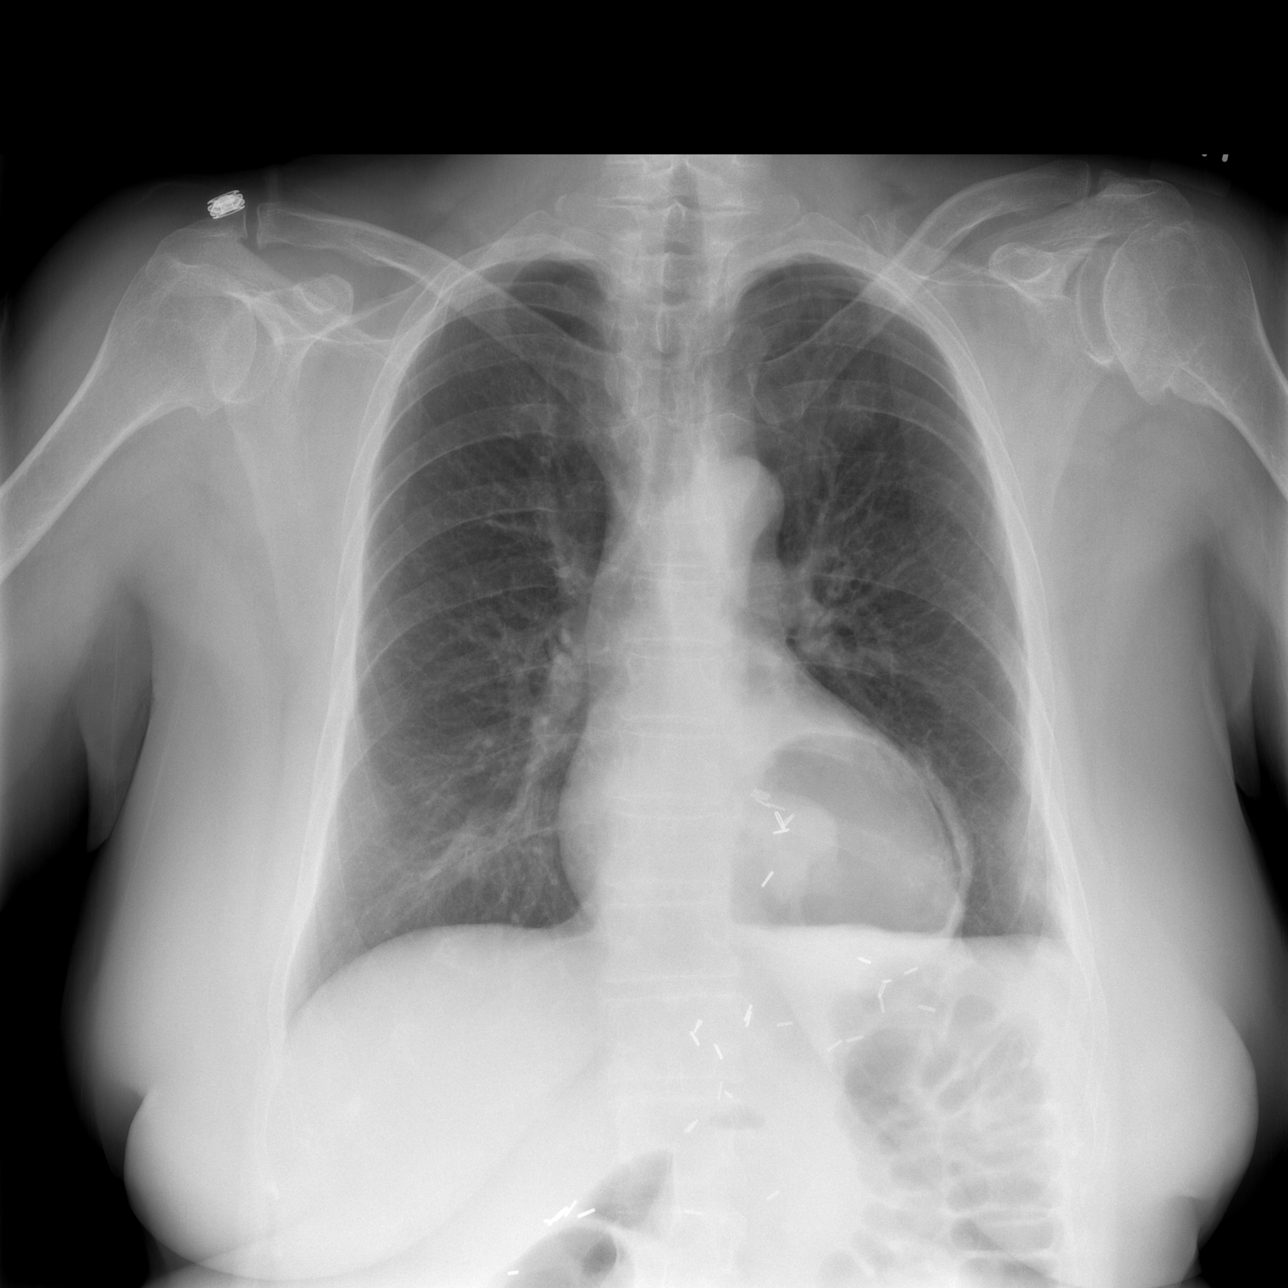

[w abdomen upright]
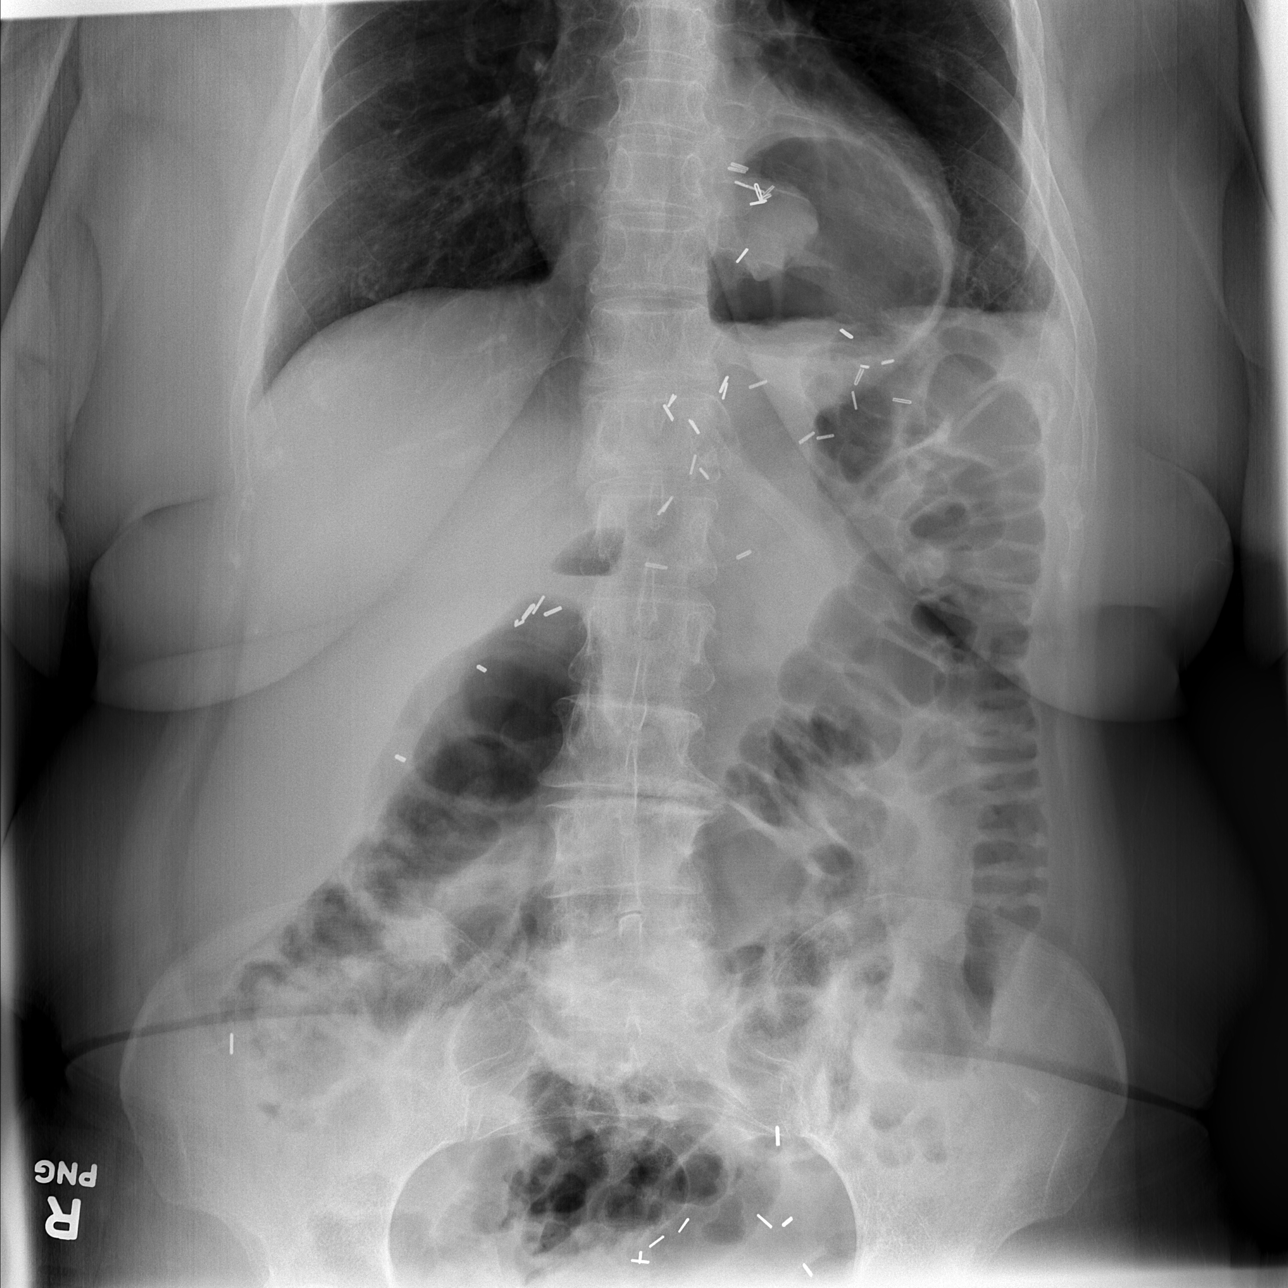

[t abdomen supine]
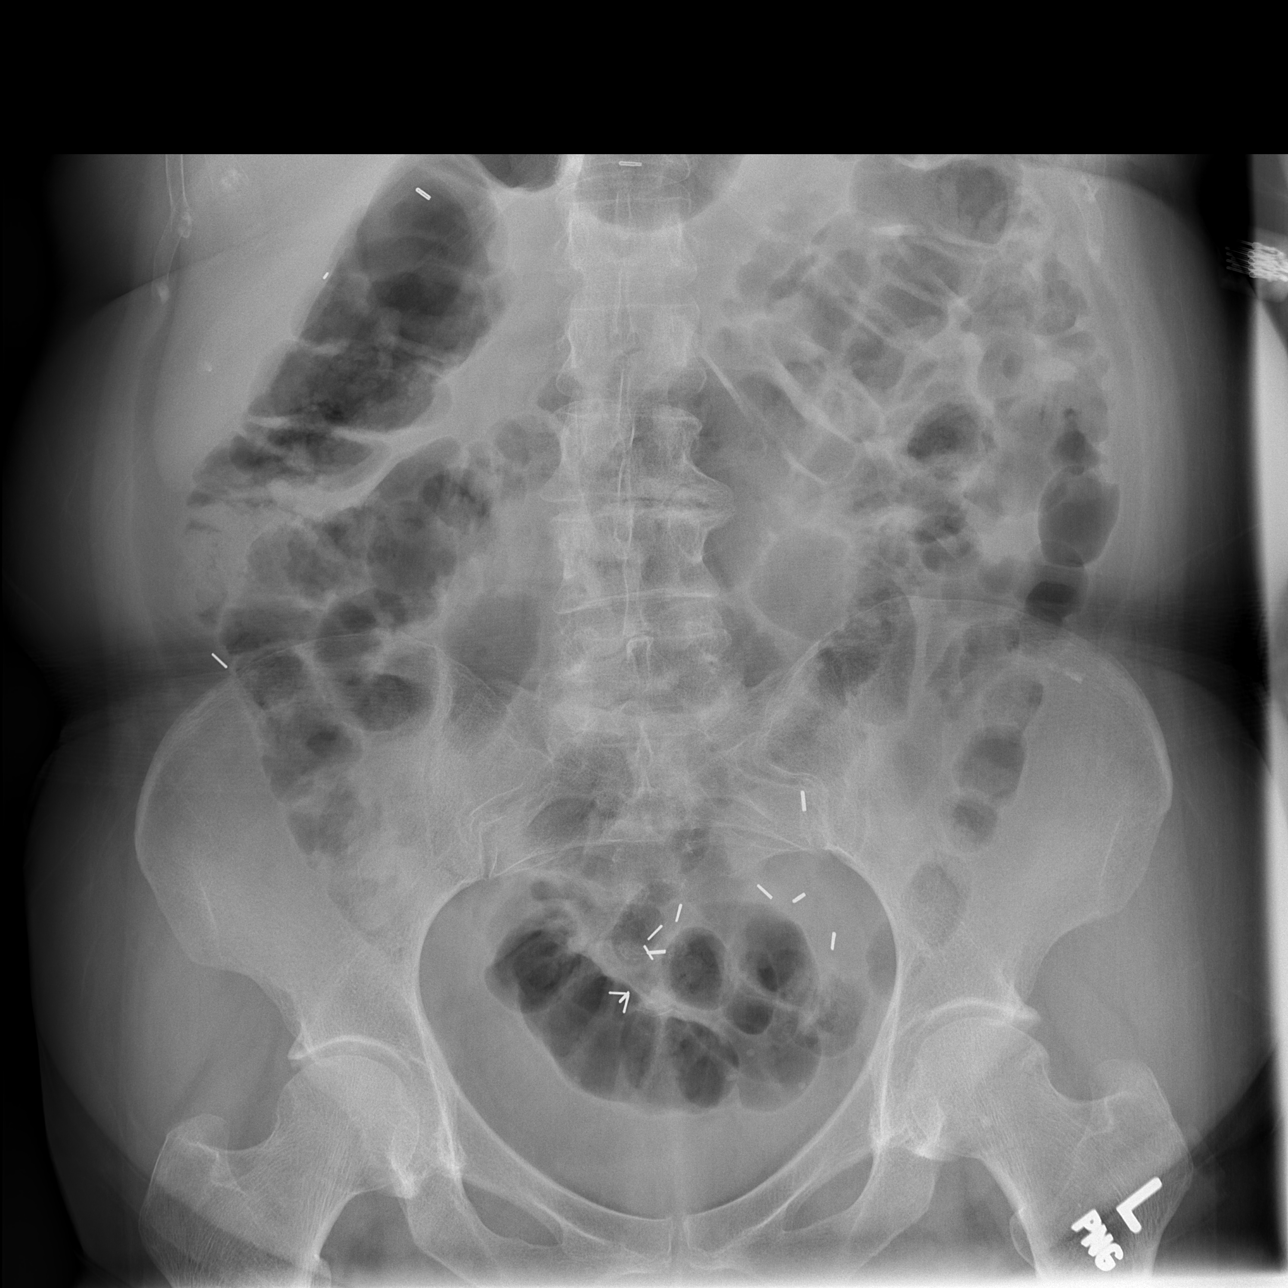

[3 of 3 positions shown; findings below may reference images not displayed]

FINDINGS: PA chest: There is No edema or consolidation. Heart is upper normal
in size with normal pulmonary vascularity. No adenopathy. There is a
large paraesophageal type hernia.

Supine and upright abdomen: Overall bowel gas pattern is normal. No
obstruction or free air. Liver appears enlarged. There are multiple
surgical clips throughout the abdomen and pelvis. There are no
abnormal calcifications.
IMPRESSION: Large paraesophageal type hernia. Bowel gas pattern unremarkable.
Liver is prominent. No edema or consolidation.

## 2014-09-17 IMAGING — US US RENAL KIDNEY
1 series · 14 of 22 positions shown · non-contrast
Comparison: 09/18/2013

CLINICAL DATA: Right flank pain.

EXAM:
RENAL/URINARY TRACT ULTRASOUND COMPLETE

[Series 1: us renal kidney · 0.28mm/px · 14 of 22 slices shown]
[im 1/22]
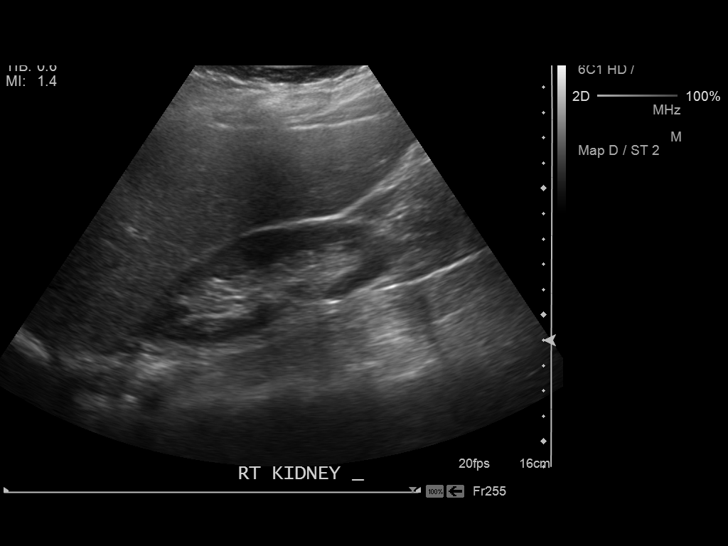
[im 3/22]
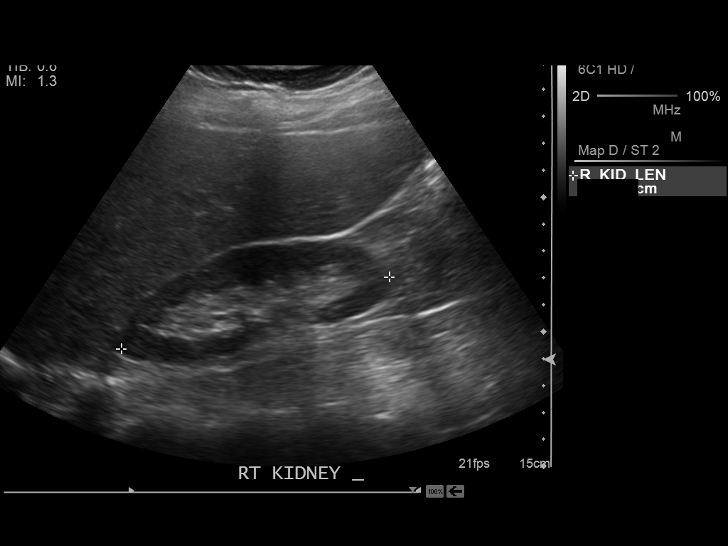
[im 4/22]
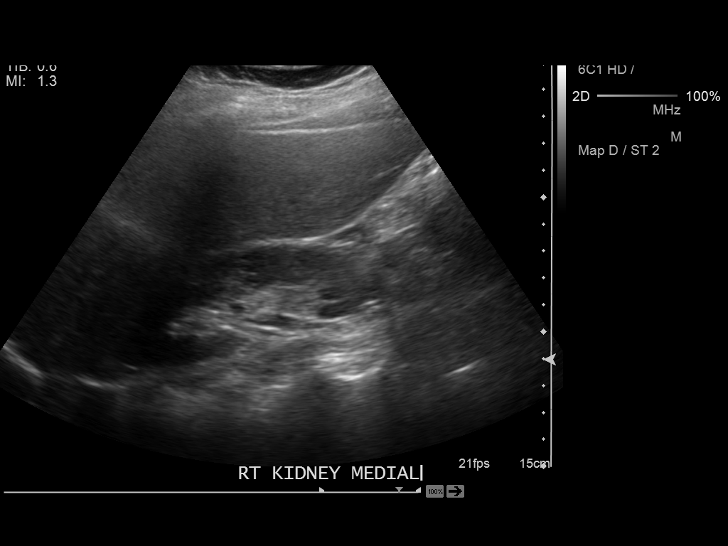
[im 6/22]
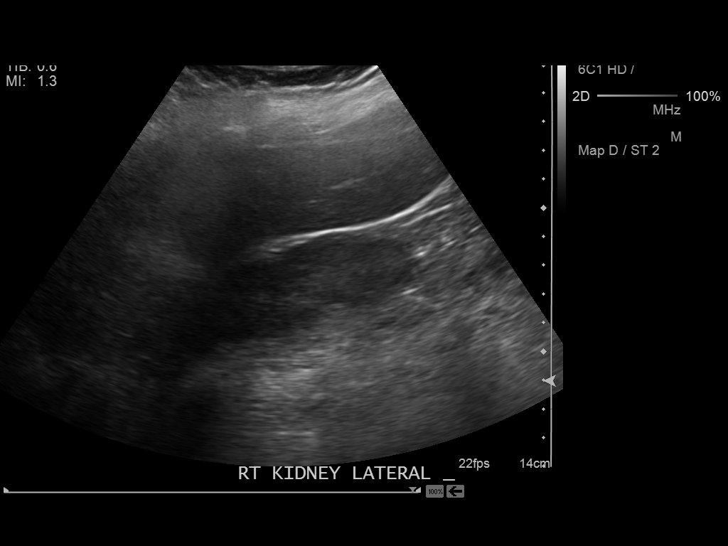
[im 8/22]
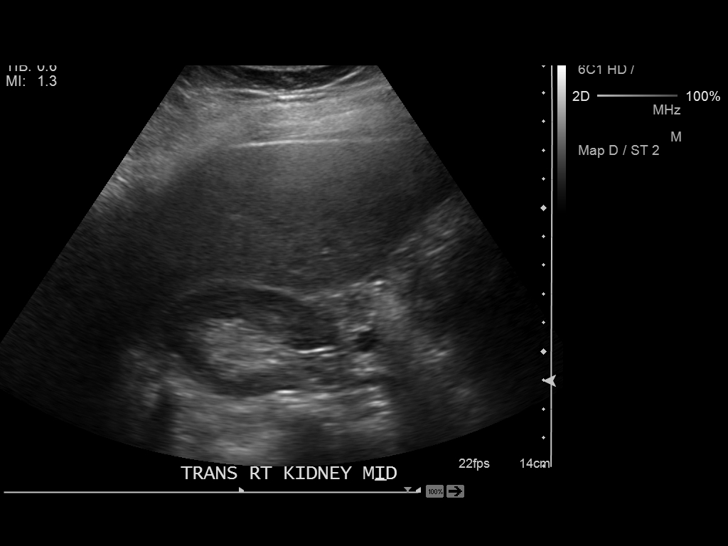
[im 9/22]
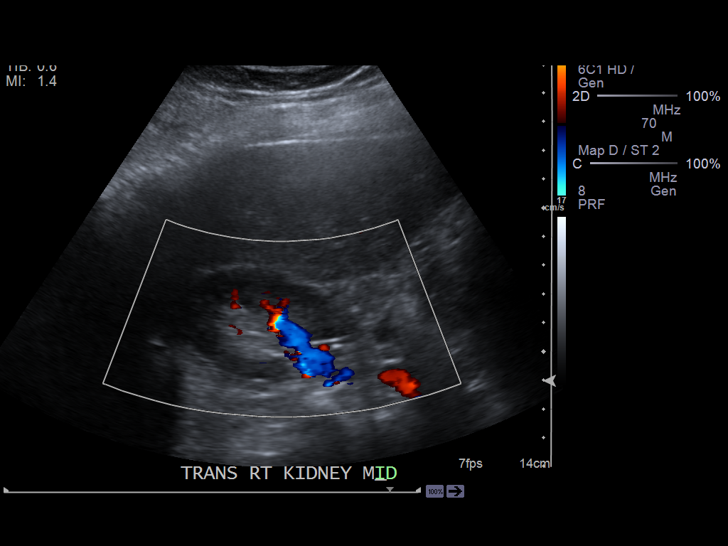
[im 11/22]
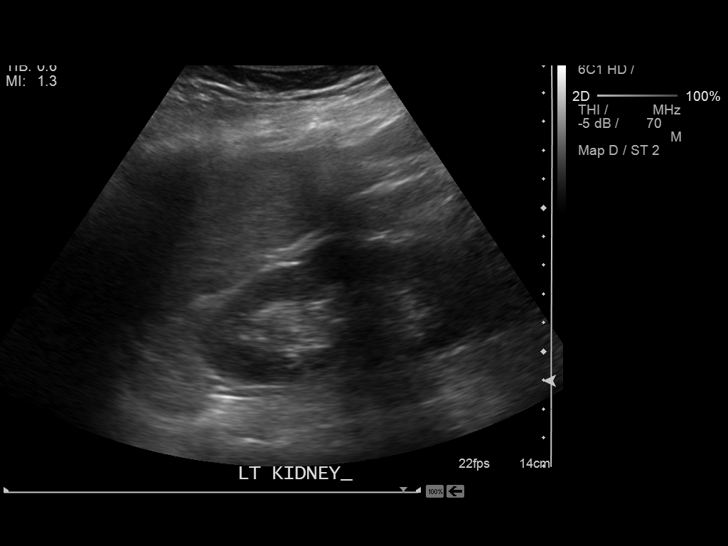
[im 12/22]
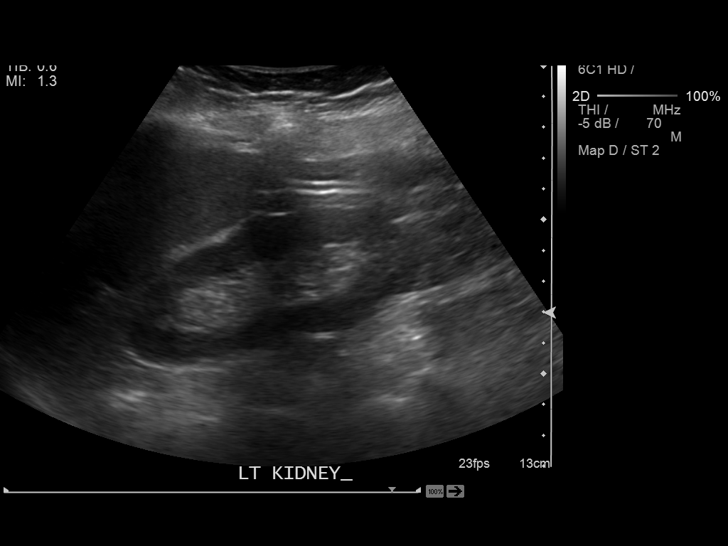
[im 14/22]
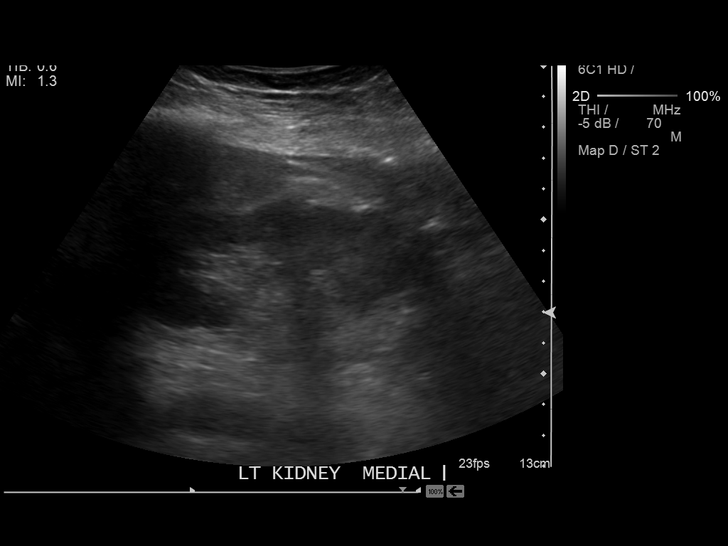
[im 15/22]
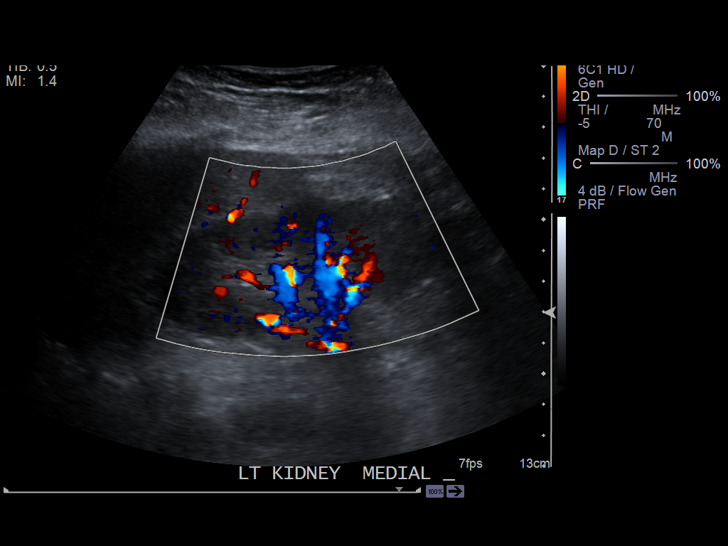
[im 17/22]
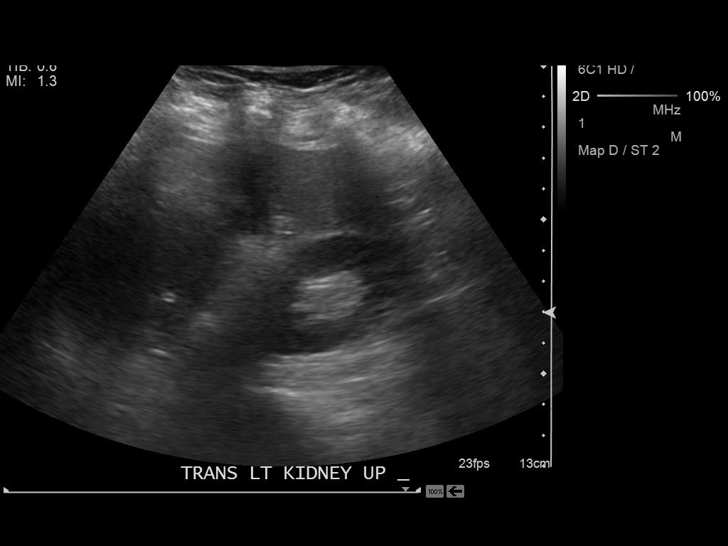
[im 19/22]
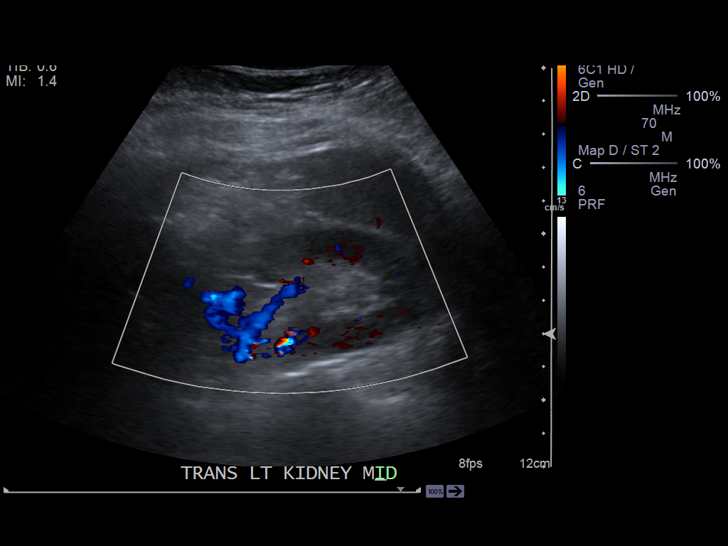
[im 20/22]
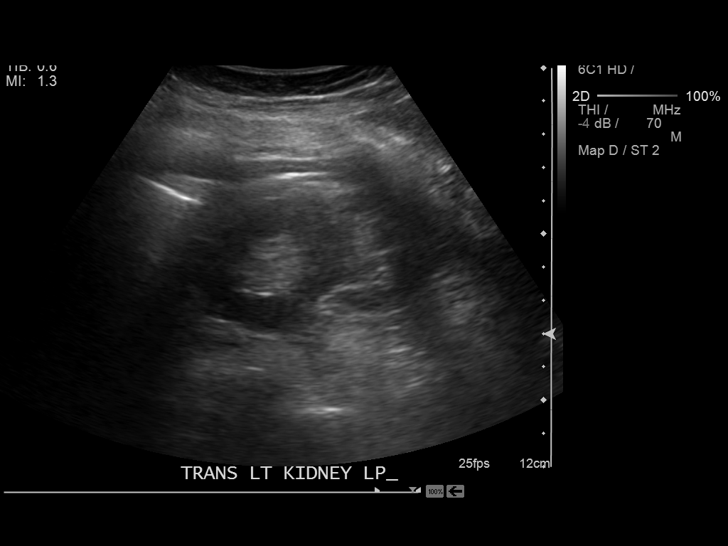
[im 22/22]
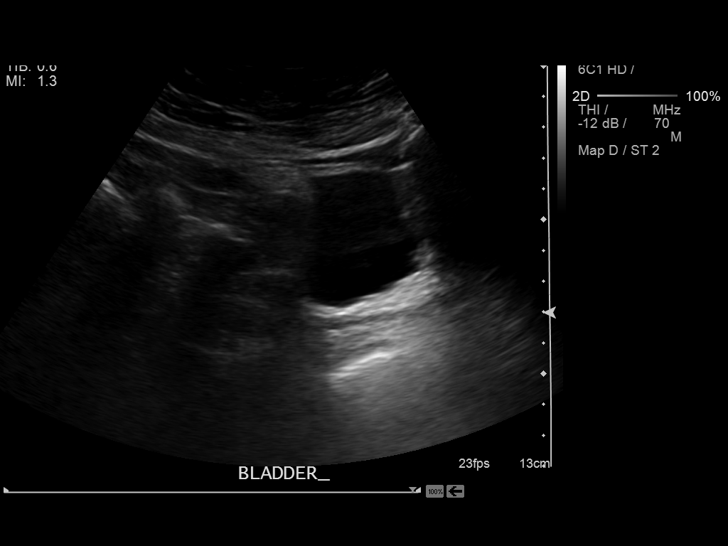

[14 of 22 positions shown; findings below may reference images not displayed]

FINDINGS: Right Kidney:

Length: 10.3 cm. Echogenicity within normal limits. No mass or
hydronephrosis visualized. No visible stones.

Left Kidney:

Length: 10.3 cm. Echogenicity within normal limits. No mass or
hydronephrosis visualized. No visible stones.

Bladder:

Appears normal for degree of bladder distention.
IMPRESSION: Unremarkable renal ultrasound.

## 2019-10-17 ENCOUNTER — Other Ambulatory Visit: Payer: Self-pay

## 2019-10-17 ENCOUNTER — Encounter (HOSPITAL_COMMUNITY): Payer: Self-pay | Admitting: Emergency Medicine

## 2019-10-17 ENCOUNTER — Emergency Department (HOSPITAL_COMMUNITY)
Admission: EM | Admit: 2019-10-17 | Discharge: 2019-10-17 | Disposition: A | Discharge status: Home / Self Care | Payer: Medicaid Other | Attending: Emergency Medicine | Admitting: Emergency Medicine

## 2019-10-17 DIAGNOSIS — E119 Type 2 diabetes mellitus without complications: Secondary | ICD-10-CM | POA: Diagnosis not present

## 2019-10-17 DIAGNOSIS — R109 Unspecified abdominal pain: Secondary | ICD-10-CM | POA: Diagnosis present

## 2019-10-17 DIAGNOSIS — N3000 Acute cystitis without hematuria: Secondary | ICD-10-CM | POA: Insufficient documentation

## 2019-10-17 LAB — URINALYSIS, ROUTINE W REFLEX MICROSCOPIC
Bilirubin Urine: NEGATIVE
Glucose, UA: NEGATIVE mg/dL
Hgb urine dipstick: NEGATIVE
Ketones, ur: NEGATIVE mg/dL
Nitrite: NEGATIVE
Protein, ur: NEGATIVE mg/dL
Specific Gravity, Urine: 1.021 (ref 1.005–1.030)
pH: 6 (ref 5.0–8.0)

## 2019-10-17 MED ORDER — CEPHALEXIN 500 MG PO CAPS
500.0000 mg | ORAL_CAPSULE | Freq: Once | ORAL | Status: AC
  Administered 2019-10-17: 500 mg via ORAL
  Filled 2019-10-17: qty 1

## 2019-10-17 MED ORDER — IBUPROFEN 600 MG PO TABS: 600.0000 mg | ORAL_TABLET | Freq: Four times a day (QID) | ORAL | 0 refills | Status: AC | PRN

## 2019-10-17 MED ORDER — PROMETHAZINE HCL 25 MG PO TABS
25.0000 mg | ORAL_TABLET | Freq: Once | ORAL | Status: AC
  Administered 2019-10-17: 25 mg via ORAL
  Filled 2019-10-17: qty 1

## 2019-10-17 MED ORDER — KETOROLAC TROMETHAMINE 60 MG/2ML IM SOLN
15.0000 mg | Freq: Once | INTRAMUSCULAR | Status: AC
  Administered 2019-10-17: 14:00:00 15 mg via INTRAMUSCULAR
  Filled 2019-10-17: qty 2

## 2019-10-17 MED ORDER — CEPHALEXIN 500 MG PO CAPS: 500.0000 mg | ORAL_CAPSULE | Freq: Two times a day (BID) | ORAL | 0 refills | Status: AC

## 2019-10-17 NOTE — Discharge Instructions (Signed)
Your urine test today did not show any blood in it.  You may still be passing a kidney stone, however it may be too small to cause any injuries and bleeding.  It should pass on its own.  Your urine test also showed the possibility of a urine infection.  This was not a definitive diagnosis.  However, based on your symptoms, I felt it was reasonable to begin treating it with Keflex and antibiotic for urine infections.  Please take the full course of antibiotics.  Finally, included the phone number for our urology specialist.  Is important that you follow-up with them for your recurring kidney stone issues.  In the future you can discharge your symptoms with them on the phone before considering the need to visit the Emergency Department.
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
## 2019-10-17 NOTE — ED Triage Notes (Signed)
Pt c/o left flank pains that started couple hours ago. Reports vomiting and urinary frequency.

## 2019-10-18 LAB — URINE CULTURE
Culture: <10000 — AB
Special Requests: NORMAL

## 2019-12-02 ENCOUNTER — Emergency Department (HOSPITAL_COMMUNITY)
Admission: EM | Admit: 2019-12-02 | Discharge: 2019-12-02 | Disposition: A | Discharge status: Home / Self Care | Payer: Medicaid Other | Attending: Emergency Medicine | Admitting: Emergency Medicine

## 2019-12-02 ENCOUNTER — Encounter (HOSPITAL_COMMUNITY): Payer: Self-pay | Admitting: Emergency Medicine

## 2019-12-02 ENCOUNTER — Other Ambulatory Visit: Payer: Self-pay

## 2019-12-02 ENCOUNTER — Emergency Department (HOSPITAL_COMMUNITY): Payer: Medicaid Other

## 2019-12-02 DIAGNOSIS — N390 Urinary tract infection, site not specified: Secondary | ICD-10-CM | POA: Diagnosis not present

## 2019-12-02 DIAGNOSIS — G8929 Other chronic pain: Secondary | ICD-10-CM | POA: Diagnosis not present

## 2019-12-02 DIAGNOSIS — E119 Type 2 diabetes mellitus without complications: Secondary | ICD-10-CM | POA: Insufficient documentation

## 2019-12-02 DIAGNOSIS — R109 Unspecified abdominal pain: Secondary | ICD-10-CM

## 2019-12-02 LAB — URINALYSIS, ROUTINE W REFLEX MICROSCOPIC
Bilirubin Urine: NEGATIVE
Glucose, UA: NEGATIVE mg/dL
Hgb urine dipstick: NEGATIVE
Ketones, ur: NEGATIVE mg/dL
Nitrite: NEGATIVE
Protein, ur: NEGATIVE mg/dL
Specific Gravity, Urine: 1.023 (ref 1.005–1.030)
pH: 6 (ref 5.0–8.0)

## 2019-12-02 LAB — BASIC METABOLIC PANEL
Anion gap: 11 (ref 5–15)
BUN: 15 mg/dL (ref 8–23)
CO2: 23 mmol/L (ref 22–32)
Calcium: 9.2 mg/dL (ref 8.9–10.3)
Chloride: 105 mmol/L (ref 98–111)
Creatinine, Ser: 0.97 mg/dL (ref 0.44–1.00)
GFR calc Af Amer: >60 mL/min (ref >60)
GFR calc non Af Amer: >60 mL/min (ref >60)
Glucose, Bld: 125 mg/dL — ABNORMAL HIGH (ref 70–99)
Potassium: 3.9 mmol/L (ref 3.5–5.1)
Sodium: 139 mmol/L (ref 135–145)

## 2019-12-02 LAB — CBC
HCT: 40.4 % (ref 36.0–46.0)
Hemoglobin: 12.2 g/dL (ref 12.0–15.0)
MCH: 28 pg (ref 26.0–34.0)
MCHC: 30.2 g/dL (ref 30.0–36.0)
MCV: 92.9 fL (ref 80.0–100.0)
Platelets: 402 10*3/uL — ABNORMAL HIGH (ref 150–400)
RBC: 4.35 MIL/uL (ref 3.87–5.11)
RDW: 14.8 % (ref 11.5–15.5)
WBC: 8.2 10*3/uL (ref 4.0–10.5)
nRBC: 0 % (ref 0.0–0.2)

## 2019-12-02 LAB — LIPASE, BLOOD: Lipase: 36 U/L (ref 11–51)

## 2019-12-02 LAB — HEPATIC FUNCTION PANEL
ALT: 13 U/L (ref 0–44)
AST: 18 U/L (ref 15–41)
Albumin: 3.8 g/dL (ref 3.5–5.0)
Alkaline Phosphatase: 73 U/L (ref 38–126)
Bilirubin, Direct: <0.1 mg/dL (ref 0.0–0.2)
Total Bilirubin: 0.3 mg/dL (ref 0.3–1.2)
Total Protein: 7.3 g/dL (ref 6.5–8.1)

## 2019-12-02 MED ORDER — HYDROMORPHONE HCL 1 MG/ML IJ SOLN
0.5000 mg | Freq: Once | INTRAMUSCULAR | Status: AC
  Administered 2019-12-02: 0.5 mg via INTRAMUSCULAR

## 2019-12-02 MED ORDER — HYDROMORPHONE HCL 1 MG/ML IJ SOLN
0.5000 mg | Freq: Once | INTRAMUSCULAR | Status: DC
  Filled 2019-12-02: qty 1

## 2019-12-02 MED ORDER — CIPROFLOXACIN HCL 500 MG PO TABS: 500.0000 mg | ORAL_TABLET | Freq: Two times a day (BID) | ORAL | 0 refills | Status: AC

## 2019-12-02 MED ORDER — PROMETHAZINE HCL 25 MG PO TABS: 25.0000 mg | ORAL_TABLET | Freq: Four times a day (QID) | ORAL | 0 refills | Status: AC | PRN

## 2019-12-02 NOTE — ED Notes (Signed)
Patient given discharge instructions patient verbalizes understanding. 

## 2019-12-02 NOTE — ED Provider Notes (Signed)
[facility] DEPARTMENT Provider Note   CSN: [id number] Arrival date & time: 12/02/19  1328     History Chief Complaint  Patient presents with  . Flank Pain  . Emesis    Ana Jarvis is a 64 y.o. female presents today with left-sided flank pain beginning around 5 AM this morning constant sharp nonradiating worsened with movement and palpation no alleviating factors.  Patient reports feels exactly similar to prior kidney stones.  Reports associated hematuria and nausea.  Denies fever/chills, headache, chest pain/shortness of breath, abdominal pain, vomiting, diarrhea, dysuria, fall/injury, numbness/tingling, weakness or any additional concerns.  HPI     Past Medical History:  Diagnosis Date  . Chronic back pain   . Chronic flank pain   . DDD (degenerative disc disease), lumbar   . Diabetes mellitus without complication ([place])   . Hiatal hernia   . Kidney stone   . Rotator cuff tear     There are no problems to display for this patient.   Past Surgical History:  Procedure Laterality Date  . ABDOMINAL HYSTERECTOMY    . BACK SURGERY    . CHOLECYSTECTOMY    . HERNIA REPAIR    . HIATAL HERNIA REPAIR     x 6  . lithrotripsy       OB History   No obstetric history on file.     Family History  Problem Relation Age of Onset  . Diabetes Mother   . Cancer Father   . Diabetes Sister   . Diabetes Brother     Social History   Tobacco Use  . Smoking status: Never Smoker  . Smokeless tobacco: Never Used  Substance Use Topics  . Alcohol use: No  . Drug use: No    Home Medications Prior to Admission medications   Medication Sig Start Date End Date Taking? Authorizing Provider  Esomeprazole Magnesium (NEXIUM PO) Take 22.3 mg by mouth daily. *otc strength nexium*    [provider]  HYDROcodone-acetaminophen (NORCO/VICODIN) 5-325 MG per tablet Take 1-2 tablets by mouth every 6 (six) hours as needed. 09/18/13   Muthersbaugh,  Jarrett Soho, PA-C  ibuprofen (ADVIL) 600 MG tablet Take 1 tablet (600 mg total) by mouth every 6 (six) hours as needed for up to 20 doses for mild pain or moderate pain. 10/17/19   Wyvonnia Dusky, MD  methocarbamol (ROBAXIN) 500 MG tablet Take 2 tablets (1,000 mg total) by mouth 4 (four) times daily as needed for muscle spasms (muscle spasm/pain). 09/23/13   Francine Graven, DO  promethazine (PHENERGAN) 25 MG tablet Take 25 mg by mouth every 6 (six) hours as needed for nausea or vomiting.    [provider]    Allergies    Ceclor [cefaclor], Cinnamon, Demerol [meperidine], Macrobid [nitrofurantoin macrocrystal], Opium, Reglan [metoclopramide], Anzemet [dolasetron], Aspirin, Compazine [prochlorperazine edisylate], Morphine and related, Norflex [orphenadrine citrate], Nubain [nalbuphine hcl], Penicillins, Percocet [oxycodone-acetaminophen], Stadol [butorphanol], Toradol [ketorolac tromethamine], Ultram [tramadol], Vistaril [hydroxyzine hcl], and Zofran [ondansetron hcl]  Review of Systems   Review of Systems Ten systems are reviewed and are negative for acute change except as noted in the HPI  Physical Exam Updated Vital Signs BP 139/84   Pulse 93   Temp 98.1 F (36.7 C) (Oral)   Resp 15   SpO2 99%   Physical Exam Constitutional:      General: She is not in acute distress.    Appearance: Normal appearance. She is well-developed. She is not ill-appearing or diaphoretic.  HENT:  Head: Normocephalic and atraumatic.     Right Ear: External ear normal.     Left Ear: External ear normal.     Nose: Nose normal.  Eyes:     General: Vision grossly intact. Gaze aligned appropriately.     Pupils: Pupils are equal, round, and reactive to light.  Neck:     Trachea: Trachea and phonation normal. No tracheal deviation.  Cardiovascular:     Pulses:          Dorsalis pedis pulses are 2+ on the right side and 2+ on the left side.  Pulmonary:     Effort: Pulmonary effort is normal. No  respiratory distress.  Abdominal:     General: There is no distension.     Palpations: Abdomen is soft.     Tenderness: There is no abdominal tenderness. There is left CVA tenderness. There is no right CVA tenderness, guarding or rebound.  Musculoskeletal:        General: Normal range of motion.     Cervical back: Normal range of motion.     Comments: No midline C/T/L spinal tenderness to palpation, no deformity, crepitus, or step-off noted. No sign of injury to the neck or back.  Feet:     Right foot:     Protective Sensation: 3 sites tested. 3 sites sensed.     Left foot:     Protective Sensation: 3 sites tested. 3 sites sensed.  Skin:    General: Skin is warm and dry.  Neurological:     Mental Status: She is alert.     GCS: GCS eye subscore is 4. GCS verbal subscore is 5. GCS motor subscore is 6.     Comments: Speech is clear and goal oriented, follows commands Major Cranial nerves without deficit, no facial droop Moves extremities without ataxia, coordination intact  Psychiatric:        Behavior: Behavior normal.     ED Results / Procedures / Treatments   Labs (all labs ordered are listed, but only abnormal results are displayed) Labs Reviewed  BASIC METABOLIC PANEL - Abnormal; Notable for the following components:      Result Value   Glucose, Bld 125 (*)    All other components within normal limits  CBC - Abnormal; Notable for the following components:   Platelets 402 (*)    All other components within normal limits  URINE CULTURE  HEPATIC FUNCTION PANEL  LIPASE, BLOOD  URINALYSIS, ROUTINE W REFLEX MICROSCOPIC    EKG None  Radiology No results found.  Procedures Procedures (including critical care time)  Medications Ordered in ED Medications - No data to display  ED Course  I have reviewed the triage vital signs and the nursing notes.  Pertinent labs & imaging results that were available during my care of the patient were reviewed by me and considered  in my medical decision making (see chart for details).    MDM Rules/Calculators/A&P                     64 year old female presents today for left flank pain beginning this morning with associated nausea and hematuria.  She reports very similar to previous kidney stone disease.  On arrival she is well-appearing vital signs stable no acute distress appears somewhat uncomfortable.  Abdominal lab work ordered and CT renal stone study ordered. - CBC without evidence of leukocytosis to suggest infection or evidence of anemia.  LFTs within normal limits.  Lipase within normal limits.  BMP shows no acute electrolyte derangement or evidence of kidney injury.  CT Renal Stone Study:  IMPRESSION:  1. No acute finding on a noncontrast scan to explain the patient's  abdominal pain.  2. No nephrolithiasis or hydronephrosis.  3. Colonic diverticula without evidence of diverticulitis.  4. Large hiatal hernia.    Aortic Atherosclerosis (ICD10-I70.0).  - Pain medication ordered, urinalysis pending.  Care handoff given to Terance Hart PA-C at shift change.  Plan of care is to follow-up on urinalysis and reassess after pain control, disposition per oncoming team.   Note: Portions of this report may have been transcribed using voice recognition software. Every effort was made to ensure accuracy; however, inadvertent computerized transcription errors may still be present. Final Clinical Impression(s) / ED Diagnoses Final diagnoses:  None    Rx / DC Orders ED Discharge Orders    None       Elizabeth Palau 12/02/19 1546    Virgina Norfolk, DO 12/02/19 1625

## 2019-12-02 NOTE — ED Provider Notes (Signed)
64 year old female presents with L flank pain, vomiting, hematuria. Of note pt has frequent ED visits to multiple facilities, a OD score of 610, 19 allergies, and has hx of drug seeking behavior  CT is negative. UA shows large leukocytes, many bacteria, 21-50 WBC but is possibly contaminated. Culture sent. She reports dysuria and can tolerate Cipro so will treat clinically. She was given IM Dilaudid by previous team and immediately states she is nauseous and needs phenergan. I went to talk to her and advised she can have a rx for home. Advised f/u with Urology for her frequent UTIs   Bethel Born, PA-C 12/02/19 1622    Charlynne Pander, MD 12/02/19 [phone]

## 2019-12-02 NOTE — ED Triage Notes (Signed)
Pt reports L flank pain radiating around to her abdomen with vomiting that began an hour ago. Significant hx of renal stones. Endorses hematuria.

## 2019-12-02 NOTE — Discharge Instructions (Signed)
Start Cipro 500mg  twice daily for 5 days Take phenergan as needed for nausea

## 2019-12-03 LAB — URINE CULTURE

## 2023-05-10 ENCOUNTER — Ambulatory Visit: Payer: 59 | Admitting: Neurology

## 2023-07-18 DIAGNOSIS — I361 Nonrheumatic tricuspid (valve) insufficiency: Secondary | ICD-10-CM | POA: Diagnosis not present
